# Patient Record
Sex: Female | Born: 1980 | Race: Black or African American | Hispanic: No | Marital: Married | Smoking: Former smoker
Health system: Southern US, Community
[De-identification: ages and names within clinical notes are randomized; demographics above are authoritative.]

## PROBLEM LIST (undated history)

## (undated) DIAGNOSIS — Z8759 Personal history of other complications of pregnancy, childbirth and the puerperium: Secondary | ICD-10-CM

## (undated) DIAGNOSIS — Z789 Other specified health status: Secondary | ICD-10-CM

## (undated) DIAGNOSIS — I10 Essential (primary) hypertension: Secondary | ICD-10-CM

---

## 2011-09-13 ENCOUNTER — Emergency Department (HOSPITAL_COMMUNITY): Payer: BC Managed Care – PPO

## 2011-09-13 ENCOUNTER — Emergency Department (HOSPITAL_COMMUNITY)
Admission: EM | Admit: 2011-09-13 | Discharge: 2011-09-13 | Disposition: A | Discharge status: Home / Self Care | Payer: BC Managed Care – PPO | Attending: Emergency Medicine | Admitting: Emergency Medicine

## 2011-09-13 ENCOUNTER — Encounter (HOSPITAL_COMMUNITY): Payer: Self-pay

## 2011-09-13 DIAGNOSIS — K8020 Calculus of gallbladder without cholecystitis without obstruction: Secondary | ICD-10-CM | POA: Insufficient documentation

## 2011-09-13 DIAGNOSIS — R112 Nausea with vomiting, unspecified: Secondary | ICD-10-CM | POA: Insufficient documentation

## 2011-09-13 DIAGNOSIS — D72829 Elevated white blood cell count, unspecified: Secondary | ICD-10-CM | POA: Insufficient documentation

## 2011-09-13 DIAGNOSIS — R1011 Right upper quadrant pain: Secondary | ICD-10-CM | POA: Insufficient documentation

## 2011-09-13 LAB — CBC WITH DIFFERENTIAL/PLATELET
HCT: 37.9 % (ref 36.0–46.0)
Hemoglobin: 12.4 g/dL (ref 12.0–15.0)
Lymphocytes Relative: 11 % — ABNORMAL LOW (ref 12–46)
MCHC: 32.7 g/dL (ref 30.0–36.0)
MCV: 80.3 fL (ref 78.0–100.0)
Monocytes Absolute: 0.8 10*3/uL (ref 0.1–1.0)
Monocytes Relative: 6 % (ref 3–12)
Neutro Abs: 11.6 10*3/uL — ABNORMAL HIGH (ref 1.7–7.7)
WBC: 14 10*3/uL — ABNORMAL HIGH (ref 4.0–10.5)

## 2011-09-13 LAB — URINALYSIS, ROUTINE W REFLEX MICROSCOPIC
Glucose, UA: NEGATIVE mg/dL
Hgb urine dipstick: NEGATIVE
Ketones, ur: 15 mg/dL — AB
Leukocytes, UA: NEGATIVE
pH: 7 (ref 5.0–8.0)

## 2011-09-13 LAB — COMPREHENSIVE METABOLIC PANEL
BUN: 7 mg/dL (ref 6–23)
CO2: 22 mEq/L (ref 19–32)
Chloride: 101 mEq/L (ref 96–112)
Creatinine, Ser: 0.79 mg/dL (ref 0.50–1.10)
GFR calc Af Amer: >90 mL/min (ref >90)
GFR calc non Af Amer: >90 mL/min (ref >90)
Total Bilirubin: 1.9 mg/dL — ABNORMAL HIGH (ref 0.3–1.2)

## 2011-09-13 LAB — POCT PREGNANCY, URINE: Preg Test, Ur: NEGATIVE

## 2011-09-13 LAB — LIPASE, BLOOD: Lipase: 24 U/L (ref 11–59)

## 2011-09-13 MED ORDER — ONDANSETRON HCL 4 MG/2ML IJ SOLN
4.0000 mg | Freq: Once | INTRAMUSCULAR | Status: AC
  Administered 2011-09-13: 4 mg via INTRAVENOUS
  Filled 2011-09-13: qty 2

## 2011-09-13 MED ORDER — ONDANSETRON 8 MG PO TBDP: 8.0000 mg | ORAL_TABLET | Freq: Three times a day (TID) | ORAL | Status: DC | PRN

## 2011-09-13 MED ORDER — SODIUM CHLORIDE 0.9 % IV SOLN
Freq: Once | INTRAVENOUS | Status: AC
  Administered 2011-09-13: 17:00:00 via INTRAVENOUS

## 2011-09-13 MED ORDER — HYDROCODONE-ACETAMINOPHEN 5-325 MG PO TABS: 2.0000 | ORAL_TABLET | Freq: Four times a day (QID) | ORAL | Status: AC | PRN

## 2011-09-13 MED ORDER — MORPHINE SULFATE 4 MG/ML IJ SOLN
4.0000 mg | Freq: Once | INTRAMUSCULAR | Status: AC
  Administered 2011-09-13: 4 mg via INTRAVENOUS
  Filled 2011-09-13: qty 1

## 2011-09-13 NOTE — ED Notes (Signed)
Pt complains of abd pain seen [facility] yesterday and pain subsided today pain subsided, and pt sts that came back today.

## 2011-09-13 NOTE — ED Provider Notes (Signed)
History     CSN: [id number]  Arrival date & time 09/13/11  1207   First MD Initiated Contact with Patient 09/13/11 1608      Chief Complaint  Patient presents with  . Abdominal Pain    (Consider location/radiation/quality/duration/timing/severity/associated sxs/prior treatment) HPI Comments: Patient presents today with RUQ abdominal pain.  Pain has been present intermittently over the past couple months, but became worse yesterday.  Pain is associated with eating spicy foods and foods high in fat.  She was seen at [facility] yesterday and was given prescription for Pepcid and Phenergan.  Her pain than eased up, but became worse again this morning.  Pain has been constant since this morning.  She had three episodes of vomiting yesterday and one episode of vomiting today.  She states that her emesis is undigested food.  She denies fever or chills.  No known history of Gallbladder disease.  No family history of gallbladder disease.    Patient is a 31 y.o. female presenting with abdominal pain. The history is provided by the patient.  Abdominal Pain The primary symptoms of the illness include abdominal pain, nausea and vomiting. The primary symptoms of the illness do not include fever, diarrhea or dysuria.  The patient states that she believes she is currently not pregnant. The patient has not had a change in bowel habit. Symptoms associated with the illness do not include chills, heartburn, constipation, urgency or frequency. Significant associated medical issues do not include gallstones.    No past medical history on file.  No past surgical history on file.  No family history on file.  History  Substance Use Topics  . Smoking status: Never Smoker   . Smokeless tobacco: Not on file  . Alcohol Use: No    OB History    Grav Para Term Preterm Abortions TAB SAB Ect Mult Living                  Review of Systems  Constitutional: Negative for fever and chills.  Cardiovascular: Negative for  chest pain.  Gastrointestinal: Positive for nausea, vomiting and abdominal pain. Negative for heartburn, diarrhea and constipation.  Genitourinary: Negative for dysuria, urgency and frequency.  Neurological: Negative for dizziness, syncope and light-headedness.    Allergies  Review of patient's allergies indicates no known allergies.  Home Medications   Current Outpatient Rx  Name Route Sig Dispense Refill  . CALCIUM-MAGNESIUM-ZINC PO Oral Take 1 tablet by mouth 2 (two) times daily with a meal.    . FAMOTIDINE 20 MG PO TABS Oral Take 20 mg by mouth 2 (two) times daily.    Marland Kitchen FISH OIL PO Oral Take 1 capsule by mouth 2 (two) times daily with a meal.    . PROMETHAZINE HCL 12.5 MG PO TABS Oral Take 12.5-25 mg by mouth every 6 (six) hours as needed. For nausea      BP 140/85  Pulse 63  Temp 98.5 F (36.9 C) (Oral)  Resp 16  SpO2 99%  Physical Exam  Nursing note and vitals reviewed. Constitutional: She appears well-developed and well-nourished. No distress.  HENT:  Head: Normocephalic and atraumatic.  Mouth/Throat: Oropharynx is clear and moist.  Neck: Normal range of motion. Neck supple.  Cardiovascular: Normal rate, regular rhythm and normal heart sounds.   Pulmonary/Chest: Effort normal and breath sounds normal.  Abdominal: Soft. Normal appearance and bowel sounds are normal. She exhibits no mass. There is tenderness in the right upper quadrant. There is no rigidity, no rebound,  no guarding, no tenderness at McBurney's point and negative Murphy's sign.  Musculoskeletal: Normal range of motion.  Neurological: She is alert.  Skin: Skin is warm and dry. She is not diaphoretic.  Psychiatric: She has a normal mood and affect.    ED Course  Procedures (including critical care time)  Labs Reviewed  URINALYSIS, ROUTINE W REFLEX MICROSCOPIC - Abnormal; Notable for the following:    Bilirubin Urine SMALL (*)     Ketones, ur 15 (*)     Urobilinogen, UA 2.0 (*)     All other  components within normal limits  CBC WITH DIFFERENTIAL - Abnormal; Notable for the following:    WBC 14.0 (*)     Neutrophils Relative 83 (*)     Neutro Abs 11.6 (*)     Lymphocytes Relative 11 (*)     All other components within normal limits  COMPREHENSIVE METABOLIC PANEL - Abnormal; Notable for the following:    Glucose, Bld 101 (*)     AST 168 (*)     ALT 200 (*)     Total Bilirubin 1.9 (*)     All other components within normal limits  POCT PREGNANCY, URINE  LIPASE, BLOOD   US Abdomen Complete  09/13/2011  *RADIOLOGY REPORT*  Clinical Data: Right upper quadrant pain  ABDOMEN ULTRASOUND  Technique:  Complete abdominal ultrasound examination was performed including evaluation of the liver, gallbladder, bile ducts, pancreas, kidneys, spleen, IVC, and abdominal aorta.  Comparison: No comparison studies available.  Findings:  Gallbladder:  Multiple tiny layering shadowing gallstones are evident, measuring up to about 7 mm in diameter.  There is no associated gallbladder wall thickening or pericholecystic fluid. The sonographer reports no sonographic Murphy's sign.  Gallbladder wall thickness measures up to about 2 mm.  Common Bile Duct:  Mildly increased in diameter at 7 mm.  Liver:  Normal.  No focal parenchymal abnormality.  Nointrahepatic biliary dilation.  IVC:  Normal.  Pancreas:  Normal.  Spleen:  Normal.  Right kidney:  10.2 cm in long axis.  Normal.  Left kidney:  10.5 cm in long axis.  Normal.  Abdominal Aorta:  No aneurysm.  Note:  There may be a trace right pleural effusion.  IMPRESSION: Cholelithiasis without gallbladder wall thickening or pericholecystic fluid.  The sonographer reports no sonographic Murphy's sign.  Extrahepatic common duct is mildly distended at 7 mm diameter.  If clinical picture is equivocal, nuclear scintigraphy may prove helpful to further evaluate.   Original Report Authenticated By: ERIC A. MANSELL, M.D.      No diagnosis found.  Patient also evaluated by  Dr. Rulon Abide.    MDM  Patient presenting with RUQ abdominal pain along with nausea and vomiting.  Symptoms had been occurring intermittently, but have been constant and worse today.  LFT's are elevated along with mild leukocytosis.  Abdominal ultrasound ordered to rule out Acute Cholecystitis.  Abdominal ultrasound did not show cholecystitis, but did show gallstones.  Discussed results and options with the patient.  She states that she does not want to be admitted and would rather follow up with Surgery and GI outpatient.  Her pain has resolved prior to discharge.  She has not had any vomiting while in the ED.   Patient discharged home with prescription for Zofran and pain medications.  Strict return precautions discussed with patient.          Pascal Lux [place], PA-C 09/14/11 1057

## 2011-09-14 ENCOUNTER — Encounter (INDEPENDENT_AMBULATORY_CARE_PROVIDER_SITE_OTHER): Payer: Self-pay | Admitting: General Surgery

## 2011-09-14 ENCOUNTER — Ambulatory Visit (INDEPENDENT_AMBULATORY_CARE_PROVIDER_SITE_OTHER): Payer: BC Managed Care – PPO | Admitting: General Surgery

## 2011-09-14 VITALS — BP 112/70 | HR 60 | Temp 98.2°F | Resp 18 | Ht 67.0 in | Wt 259.2 lb

## 2011-09-14 DIAGNOSIS — K8020 Calculus of gallbladder without cholecystitis without obstruction: Secondary | ICD-10-CM

## 2011-09-14 NOTE — ED Provider Notes (Signed)
Pt to follow with surgery next week - with h/o symptoms she is candidate for elective cholecystectomy.  Benign abdomen on exam - pain free on my exam.  Advised lifestyle changes including small, frequent meals limiting fatty and processed foods. Pt may gfollow up as outpatient, risks and benefits discussed, no guarantees of improvement on DC given. Given explicit return precautions for worsening pain, N/V, scleral icterus, fever.  Medical screening examination/treatment/procedure(s) were performed by non-physician practitioner and as supervising physician I was immediately available for consultation/collaboration.  Jones Skene, M.D.     Jones Skene, MD 09/14/11 1249

## 2011-09-14 NOTE — Progress Notes (Signed)
Patient ID: Samantha Jacobs, female   DOB: Aug 03, 1980, 31 y.o.   MRN: [id number]  Chief Complaint  Patient presents with  . Cholelithiasis    new pt    HPI Samantha Jacobs is a 31 y.o. female.  Referred by Dr. Pernell Dupre HPI This is a 31 year old female who is otherwise healthy who presents after numerous episodes of right upper quadrant pain that occurred over the past number of months. The last was this week for which she was seen in the emergency room last night. This was a sharp right upper quadrant and epigastric pain that did not go away on its own as he usually did. There is no radiation. She has some nausea and some emesis associated with this. She denies a fever. She was seen in the emergency room last I actually had some mild abnormalities in her liver function tests as well as an ultrasound that showed cholelithiasis. She was discharged to home and I am seeing her today in our office. She states that she feels fine right now.  History reviewed. No pertinent past medical history.  History reviewed. No pertinent past surgical history.  History reviewed. No pertinent family history.  Social History History  Substance Use Topics  . Smoking status: Former Smoker    Quit date: 01/09/2001  . Smokeless tobacco: Not on file  . Alcohol Use: Yes    No Known Allergies  Current Outpatient Prescriptions  Medication Sig Dispense Refill  . CALCIUM-MAGNESIUM-ZINC PO Take 1 tablet by mouth 2 (two) times daily with a meal.      . HYDROcodone-acetaminophen (NORCO/VICODIN) 5-325 MG per tablet Take 2 tablets by mouth every 6 (six) hours as needed for pain.  20 tablet  0  . Omega-3 Fatty Acids (FISH OIL PO) Take 1 capsule by mouth 2 (two) times daily with a meal.      . ondansetron (ZOFRAN ODT) 8 MG disintegrating tablet Take 1 tablet (8 mg total) by mouth every 8 (eight) hours as needed for nausea.  20 tablet  0  . promethazine (PHENERGAN) 12.5 MG tablet Take 12.5-25 mg by mouth every 6 (six) hours as  needed. For nausea       No current facility-administered medications for this visit.   Facility-Administered Medications Ordered in Other Visits  Medication Dose Route Frequency Provider Last Rate Last Dose  . 0.9 %  sodium chloride infusion   Intravenous Once Hurman Horn, MD      . morphine 4 MG/ML injection 4 mg  4 mg Intravenous Once [employer], PA-C   4 mg at 09/13/11 1656  . ondansetron (ZOFRAN) injection 4 mg  4 mg Intravenous Once [employer], PA-C   4 mg at 09/13/11 1656    Review of Systems Review of Systems  Constitutional: Negative for fever, chills and unexpected weight change.  HENT: Negative for hearing loss, congestion, sore throat, trouble swallowing and voice change.   Eyes: Negative for visual disturbance.  Respiratory: Negative for cough and wheezing.   Cardiovascular: Negative for chest pain, palpitations and leg swelling.  Gastrointestinal: Positive for nausea, vomiting and abdominal pain. Negative for diarrhea, constipation, blood in stool, abdominal distention and anal bleeding.  Genitourinary: Negative for hematuria, vaginal bleeding and difficulty urinating.  Musculoskeletal: Negative for arthralgias.  Skin: Negative for rash and wound.  Neurological: Negative for seizures, syncope and headaches.  Hematological: Negative for adenopathy. Does not bruise/bleed easily.  Psychiatric/Behavioral: Negative for confusion.    Blood pressure 112/70, pulse 60, temperature  98.2 F (36.8 C), temperature source Temporal, resp. rate 18, height 5\' 7"  (1.702 m), weight 259 lb 3.2 oz (117.572 kg).  Physical Exam Physical Exam  Vitals reviewed. Constitutional: She appears well-developed and well-nourished.  Eyes: No scleral icterus.  Neck: Neck supple.  Cardiovascular: Normal rate, regular rhythm and normal heart sounds.   Pulmonary/Chest: Effort normal and breath sounds normal. She has no wheezes. She has no rales.  Abdominal: Soft. Bowel sounds are  normal. There is no tenderness.  Lymphadenopathy:    She has no cervical adenopathy.    Data Reviewed ABDOMEN ULTRASOUND  Technique: Complete abdominal ultrasound examination was performed  including evaluation of the liver, gallbladder, bile ducts,  pancreas, kidneys, spleen, IVC, and abdominal aorta.  Comparison: No comparison studies available.  Findings:  Gallbladder: Multiple tiny layering shadowing gallstones are  evident, measuring up to about 7 mm in diameter. There is no  associated gallbladder wall thickening or pericholecystic fluid.  The sonographer reports no sonographic Murphy's sign. Gallbladder  wall thickness measures up to about 2 mm.  Common Bile Duct: Mildly increased in diameter at 7 mm.  Liver: Normal. No focal parenchymal abnormality. Nointrahepatic  biliary dilation.  IVC: Normal.  Pancreas: Normal.  Spleen: Normal.  Right kidney: 10.2 cm in long axis. Normal.  Left kidney: 10.5 cm in long axis. Normal.  Abdominal Aorta: No aneurysm.  Note: There may be a trace right pleural effusion.  IMPRESSION:  Cholelithiasis without gallbladder wall thickening or  pericholecystic fluid. The sonographer reports no sonographic  Murphy's sign. Extrahepatic common duct is mildly distended at 7  mm diameter. If clinical picture is equivocal, nuclear  scintigraphy may prove helpful to further evaluate.   Assessment    Symptomatic cholelithiasis     Plan    I discussed the procedure in detail.  The patient was given Agricultural engineer.  We discussed the risks and benefits of a laparoscopic cholecystectomy and possible cholangiogram including, but not limited to bleeding, infection, injury to surrounding structures such as the intestine or liver, bile leak, retained gallstones, need to convert to an open procedure, prolonged diarrhea, blood clots such as  DVT, common bile duct injury, anesthesia risks, and possible need for additional procedures.  The likelihood of  improvement in symptoms and return to the patient's normal status is good. We discussed the typical post-operative recovery course. I am also going to check her lfts asap just to make sure they are better.  I am concerned that she was discharged home with abnormal lfts and pain but she feels a lot better now.       Aveion Nguyen 09/14/2011, 4:49 PM

## 2011-09-15 ENCOUNTER — Telehealth (INDEPENDENT_AMBULATORY_CARE_PROVIDER_SITE_OTHER): Payer: Self-pay

## 2011-09-15 LAB — HEPATIC FUNCTION PANEL
Albumin: 4 g/dL (ref 3.5–5.2)
Total Protein: 7 g/dL (ref 6.0–8.3)

## 2011-09-15 NOTE — Telephone Encounter (Signed)
Called pt to let her know that labs are better and ok to wait to have sx next week per Dr Dwain Sarna.

## 2011-09-19 ENCOUNTER — Encounter (HOSPITAL_COMMUNITY): Payer: Self-pay | Admitting: Pharmacy Technician

## 2011-09-20 ENCOUNTER — Encounter (HOSPITAL_COMMUNITY)
Admission: RE | Admit: 2011-09-20 | Discharge: 2011-09-20 | Disposition: A | Discharge status: Home / Self Care | Payer: BC Managed Care – PPO | Source: Ambulatory Visit | Attending: General Surgery | Admitting: General Surgery

## 2011-09-20 ENCOUNTER — Encounter (HOSPITAL_COMMUNITY): Payer: Self-pay

## 2011-09-20 LAB — CBC WITH DIFFERENTIAL/PLATELET
Basophils Absolute: 0 10*3/uL (ref 0.0–0.1)
Eosinophils Relative: 3 % (ref 0–5)
Lymphocytes Relative: 34 % (ref 12–46)
MCV: 80.2 fL (ref 78.0–100.0)
Neutro Abs: 2.9 10*3/uL (ref 1.7–7.7)
Platelets: 356 10*3/uL (ref 150–400)
RDW: 13.2 % (ref 11.5–15.5)
WBC: 5.8 10*3/uL (ref 4.0–10.5)

## 2011-09-20 LAB — SURGICAL PCR SCREEN: Staphylococcus aureus: NEGATIVE

## 2011-09-20 LAB — COMPREHENSIVE METABOLIC PANEL
ALT: 65 U/L — ABNORMAL HIGH (ref 0–35)
AST: 27 U/L (ref 0–37)
Albumin: 3.8 g/dL (ref 3.5–5.2)
CO2: 26 mEq/L (ref 19–32)
Calcium: 9.8 mg/dL (ref 8.4–10.5)
GFR calc non Af Amer: >90 mL/min (ref >90)
Sodium: 136 mEq/L (ref 135–145)

## 2011-09-20 NOTE — Patient Instructions (Addendum)
20 Sheranda Suggett  09/20/2011   Your procedure is scheduled on: 09/21/11 AT 9:30 AM  Report to SHORT STAY DEPT  At 7:00 AM.  Call this number if you have problems the morning of surgery: [phone]   Remember:   Do not eat food or drink liquids AFTER MIDNIGHT    Take these medicines the morning of surgery with A SIP OF WATER: PRILOSEC   Do not wear jewelry, make-up or nail polish.  Do not wear lotions, powders, or perfumes.   Do not shave legs or underarms 48 hrs. before surgery (men may shave face)  Do not bring valuables to the hospital.  Contacts, dentures or bridgework may not be worn into surgery.  Leave suitcase in the car. After surgery it may be brought to your room.  For patients admitted to the hospital, checkout time is 11:00 AM the day of discharge.   Patients discharged the day of surgery will not be allowed to drive home. If going home same day of surgery, must have someone stay with you first 24 hrs at home and arrange for some one to drive you home from hospital.    Special Instructions:   Please read over the following fact sheets that you were given: MRSA  Information               SHOWER WITH BETASEPT THE NIGHT BEFORE SURGERY AND THE MORNING OF SURGERY               STOP ALL ASPIRIN PRODUCTS AND HERBAL MEDICATION 5 DAYS PREOP          X_______________________________________________________________

## 2011-09-21 ENCOUNTER — Ambulatory Visit (HOSPITAL_COMMUNITY): Payer: BC Managed Care – PPO

## 2011-09-21 ENCOUNTER — Ambulatory Visit (HOSPITAL_COMMUNITY): Payer: BC Managed Care – PPO | Admitting: Anesthesiology

## 2011-09-21 ENCOUNTER — Encounter (HOSPITAL_COMMUNITY): Payer: Self-pay | Admitting: Anesthesiology

## 2011-09-21 ENCOUNTER — Encounter (HOSPITAL_COMMUNITY)
Admission: RE | Disposition: A | Discharge status: Home / Self Care | Payer: Self-pay | Source: Ambulatory Visit | Attending: General Surgery

## 2011-09-21 ENCOUNTER — Encounter (HOSPITAL_COMMUNITY): Payer: Self-pay | Admitting: *Deleted

## 2011-09-21 ENCOUNTER — Observation Stay (HOSPITAL_COMMUNITY)
Admission: RE | Admit: 2011-09-21 | Discharge: 2011-09-22 | Disposition: A | Discharge status: Home / Self Care | Payer: BC Managed Care – PPO | Source: Ambulatory Visit | Attending: General Surgery | Admitting: General Surgery

## 2011-09-21 DIAGNOSIS — K8010 Calculus of gallbladder with chronic cholecystitis without obstruction: Principal | ICD-10-CM | POA: Insufficient documentation

## 2011-09-21 DIAGNOSIS — Z79899 Other long term (current) drug therapy: Secondary | ICD-10-CM | POA: Insufficient documentation

## 2011-09-21 DIAGNOSIS — Z01812 Encounter for preprocedural laboratory examination: Secondary | ICD-10-CM | POA: Insufficient documentation

## 2011-09-21 HISTORY — PX: CHOLECYSTECTOMY: SHX55

## 2011-09-21 SURGERY — LAPAROSCOPIC CHOLECYSTECTOMY WITH INTRAOPERATIVE CHOLANGIOGRAM
Anesthesia: General | Wound class: Contaminated

## 2011-09-21 MED ORDER — HYDROMORPHONE HCL PF 1 MG/ML IJ SOLN
0.5000 mg | INTRAMUSCULAR | Status: DC | PRN
  Administered 2011-09-21 (×3): 0.5 mg via INTRAVENOUS

## 2011-09-21 MED ORDER — NEOSTIGMINE METHYLSULFATE 1 MG/ML IJ SOLN
INTRAMUSCULAR | Status: DC | PRN
  Administered 2011-09-21: 4 mg via INTRAVENOUS

## 2011-09-21 MED ORDER — ONDANSETRON HCL 4 MG/2ML IJ SOLN
INTRAMUSCULAR | Status: AC
  Filled 2011-09-21: qty 2

## 2011-09-21 MED ORDER — GLUCAGON HCL (RDNA) 1 MG IJ SOLR
INTRAMUSCULAR | Status: DC | PRN
  Administered 2011-09-21: 5 mL via INTRAVENOUS

## 2011-09-21 MED ORDER — HYDROMORPHONE HCL PF 1 MG/ML IJ SOLN
INTRAMUSCULAR | Status: AC
  Filled 2011-09-21: qty 1

## 2011-09-21 MED ORDER — LACTATED RINGERS IV SOLN
INTRAVENOUS | Status: DC
  Administered 2011-09-21: 12:00:00 via INTRAVENOUS
  Administered 2011-09-21: 1000 mL via INTRAVENOUS

## 2011-09-21 MED ORDER — GLUCAGON HCL (RDNA) 1 MG IJ SOLR
INTRAMUSCULAR | Status: AC
  Filled 2011-09-21: qty 1

## 2011-09-21 MED ORDER — FENTANYL CITRATE 0.05 MG/ML IJ SOLN
25.0000 ug | INTRAMUSCULAR | Status: DC | PRN
  Administered 2011-09-21 (×2): 50 ug via INTRAVENOUS

## 2011-09-21 MED ORDER — BUPIVACAINE-EPINEPHRINE PF 0.25-1:200000 % IJ SOLN
INTRAMUSCULAR | Status: AC
  Filled 2011-09-21: qty 30

## 2011-09-21 MED ORDER — FENTANYL CITRATE 0.05 MG/ML IJ SOLN
INTRAMUSCULAR | Status: DC | PRN
  Administered 2011-09-21 (×2): 50 ug via INTRAVENOUS
  Administered 2011-09-21: 100 ug via INTRAVENOUS
  Administered 2011-09-21: 50 ug via INTRAVENOUS

## 2011-09-21 MED ORDER — ACETAMINOPHEN 650 MG RE SUPP: 650.0000 mg | Freq: Four times a day (QID) | RECTAL | Status: DC | PRN

## 2011-09-21 MED ORDER — DEXTROSE 5 % IV SOLN
2.0000 g | INTRAVENOUS | Status: AC
  Administered 2011-09-21: 2 g via INTRAVENOUS

## 2011-09-21 MED ORDER — FENTANYL CITRATE 0.05 MG/ML IJ SOLN
INTRAMUSCULAR | Status: AC
  Filled 2011-09-21: qty 2

## 2011-09-21 MED ORDER — ONDANSETRON HCL 4 MG/2ML IJ SOLN
INTRAMUSCULAR | Status: DC | PRN
  Administered 2011-09-21: 4 mg via INTRAVENOUS

## 2011-09-21 MED ORDER — IOHEXOL 300 MG/ML  SOLN
INTRAMUSCULAR | Status: DC | PRN
  Administered 2011-09-21: 50 mL via INTRAVENOUS

## 2011-09-21 MED ORDER — CEFOXITIN SODIUM-DEXTROSE 1-4 GM-% IV SOLR (PREMIX)
INTRAVENOUS | Status: AC
  Filled 2011-09-21: qty 100

## 2011-09-21 MED ORDER — GLYCOPYRROLATE 0.2 MG/ML IJ SOLN
INTRAMUSCULAR | Status: DC | PRN
  Administered 2011-09-21: .5 mg via INTRAVENOUS

## 2011-09-21 MED ORDER — BUPIVACAINE-EPINEPHRINE (PF) 0.25% -1:200000 IJ SOLN
INTRAMUSCULAR | Status: DC | PRN
  Administered 2011-09-21: 19 mL

## 2011-09-21 MED ORDER — HYDROCODONE-ACETAMINOPHEN 5-325 MG PO TABS
1.0000 | ORAL_TABLET | Freq: Four times a day (QID) | ORAL | Status: DC | PRN
  Administered 2011-09-21 (×3): 1 via ORAL
  Administered 2011-09-22 (×2): 2 via ORAL
  Filled 2011-09-21: qty 2
  Filled 2011-09-21 (×2): qty 1
  Filled 2011-09-21: qty 2
  Filled 2011-09-21: qty 1

## 2011-09-21 MED ORDER — LACTATED RINGERS IV SOLN: INTRAVENOUS | Status: DC

## 2011-09-21 MED ORDER — HEMOSTATIC AGENTS (NO CHARGE) OPTIME
TOPICAL | Status: DC | PRN
  Administered 2011-09-21: 1 via TOPICAL

## 2011-09-21 MED ORDER — ACETAMINOPHEN 325 MG PO TABS: 650.0000 mg | ORAL_TABLET | Freq: Four times a day (QID) | ORAL | Status: DC | PRN

## 2011-09-21 MED ORDER — SODIUM CHLORIDE 0.9 % IV SOLN
INTRAVENOUS | Status: DC
  Administered 2011-09-21 – 2011-09-22 (×2): via INTRAVENOUS

## 2011-09-21 MED ORDER — IOHEXOL 300 MG/ML  SOLN
INTRAMUSCULAR | Status: AC
  Filled 2011-09-21: qty 1

## 2011-09-21 MED ORDER — HEPARIN SODIUM (PORCINE) 5000 UNIT/ML IJ SOLN
5000.0000 [IU] | Freq: Three times a day (TID) | INTRAMUSCULAR | Status: DC
  Administered 2011-09-21 – 2011-09-22 (×3): 5000 [IU] via SUBCUTANEOUS
  Filled 2011-09-21 (×7): qty 1

## 2011-09-21 MED ORDER — ONDANSETRON HCL 4 MG/2ML IJ SOLN
4.0000 mg | Freq: Four times a day (QID) | INTRAMUSCULAR | Status: DC | PRN
  Administered 2011-09-21: 4 mg via INTRAVENOUS

## 2011-09-21 MED ORDER — LACTATED RINGERS IV SOLN
INTRAVENOUS | Status: DC | PRN
  Administered 2011-09-21: 1000 mL

## 2011-09-21 MED ORDER — PANTOPRAZOLE SODIUM 40 MG PO TBEC
40.0000 mg | DELAYED_RELEASE_TABLET | Freq: Every day | ORAL | Status: DC
  Administered 2011-09-21: 40 mg via ORAL
  Filled 2011-09-21 (×2): qty 1

## 2011-09-21 MED ORDER — ROCURONIUM BROMIDE 100 MG/10ML IV SOLN
INTRAVENOUS | Status: DC | PRN
  Administered 2011-09-21: 50 mg via INTRAVENOUS

## 2011-09-21 MED ORDER — DEXAMETHASONE SODIUM PHOSPHATE 10 MG/ML IJ SOLN
INTRAMUSCULAR | Status: DC | PRN
  Administered 2011-09-21: 10 mg via INTRAVENOUS

## 2011-09-21 MED ORDER — 0.9 % SODIUM CHLORIDE (POUR BTL) OPTIME
TOPICAL | Status: DC | PRN
  Administered 2011-09-21: 1000 mL

## 2011-09-21 MED ORDER — PROPOFOL 10 MG/ML IV BOLUS
INTRAVENOUS | Status: DC | PRN
  Administered 2011-09-21: 150 mg via INTRAVENOUS

## 2011-09-21 MED ORDER — MIDAZOLAM HCL 5 MG/5ML IJ SOLN
INTRAMUSCULAR | Status: DC | PRN
  Administered 2011-09-21: 2 mg via INTRAVENOUS

## 2011-09-21 MED ORDER — MORPHINE SULFATE 2 MG/ML IJ SOLN: 2.0000 mg | INTRAMUSCULAR | Status: DC | PRN

## 2011-09-21 SURGICAL SUPPLY — 38 items
APPLIER CLIP 5 13 M/L LIGAMAX5 (MISCELLANEOUS) ×4
APPLIER CLIP ROT 10 11.4 M/L (STAPLE)
BENZOIN TINCTURE PRP APPL 2/3 (GAUZE/BANDAGES/DRESSINGS) IMPLANT
CANISTER SUCTION 2500CC (MISCELLANEOUS) ×2 IMPLANT
CLIP APPLIE 5 13 M/L LIGAMAX5 (MISCELLANEOUS) ×2 IMPLANT
CLIP APPLIE ROT 10 11.4 M/L (STAPLE) IMPLANT
CLOTH BEACON ORANGE TIMEOUT ST (SAFETY) ×2 IMPLANT
COVER MAYO STAND STRL (DRAPES) IMPLANT
DECANTER SPIKE VIAL GLASS SM (MISCELLANEOUS) ×2 IMPLANT
DERMABOND ADVANCED (GAUZE/BANDAGES/DRESSINGS) ×1
DERMABOND ADVANCED .7 DNX12 (GAUZE/BANDAGES/DRESSINGS) ×1 IMPLANT
DRAPE C-ARM 42X72 X-RAY (DRAPES) IMPLANT
DRAPE LAPAROSCOPIC ABDOMINAL (DRAPES) ×2 IMPLANT
ELECT REM PT RETURN 9FT ADLT (ELECTROSURGICAL) ×2
ELECTRODE REM PT RTRN 9FT ADLT (ELECTROSURGICAL) ×1 IMPLANT
GLOVE BIO SURGEON STRL SZ7 (GLOVE) ×2 IMPLANT
GLOVE BIOGEL PI IND STRL 7.5 (GLOVE) ×1 IMPLANT
GLOVE BIOGEL PI INDICATOR 7.5 (GLOVE) ×1
GOWN PREVENTION PLUS LG XLONG (DISPOSABLE) ×2 IMPLANT
GOWN PREVENTION PLUS XLARGE (GOWN DISPOSABLE) ×2 IMPLANT
GOWN STRL NON-REIN LRG LVL3 (GOWN DISPOSABLE) ×2 IMPLANT
GOWN STRL REIN XL XLG (GOWN DISPOSABLE) ×2 IMPLANT
HEMOSTAT SNOW SURGICEL 2X4 (HEMOSTASIS) ×2 IMPLANT
KIT BASIN OR (CUSTOM PROCEDURE TRAY) ×2 IMPLANT
NS IRRIG 1000ML POUR BTL (IV SOLUTION) ×2 IMPLANT
POUCH SPECIMEN RETRIEVAL 10MM (ENDOMECHANICALS) ×2 IMPLANT
SET CHOLANGIOGRAPH MIX (MISCELLANEOUS) ×2 IMPLANT
SET IRRIG TUBING LAPAROSCOPIC (IRRIGATION / IRRIGATOR) ×2 IMPLANT
SOLUTION ANTI FOG 6CC (MISCELLANEOUS) ×2 IMPLANT
STRIP CLOSURE SKIN 1/2X4 (GAUZE/BANDAGES/DRESSINGS) IMPLANT
SUT MNCRL AB 4-0 PS2 18 (SUTURE) ×2 IMPLANT
SUT VICRYL 0 UR6 27IN ABS (SUTURE) ×4 IMPLANT
TOWEL OR 17X26 10 PK STRL BLUE (TOWEL DISPOSABLE) ×2 IMPLANT
TRAY LAP CHOLE (CUSTOM PROCEDURE TRAY) ×2 IMPLANT
TROCAR BLADELESS OPT 5 75 (ENDOMECHANICALS) ×6 IMPLANT
TROCAR XCEL BLUNT TIP 100MML (ENDOMECHANICALS) ×2 IMPLANT
TROCAR XCEL NON-BLD 11X100MML (ENDOMECHANICALS) IMPLANT
TUBING INSUFFLATION 10FT LAP (TUBING) ×2 IMPLANT

## 2011-09-21 NOTE — H&P (View-Only) (Signed)
Patient ID: Samantha Jacobs, female   DOB: 02/06/1980, 31 y.o.   MRN: [id number]  Chief Complaint  Patient presents with  . Cholelithiasis    new pt    HPI Kaliyan Phang is a 31 y.o. female.  Referred by Dr. Adams HPI This is a 31-year-old female who is otherwise healthy who presents after numerous episodes of right upper quadrant pain that occurred over the past number of months. The last was this week for which she was seen in the emergency room last night. This was a sharp right upper quadrant and epigastric pain that did not go away on its own as he usually did. There is no radiation. She has some nausea and some emesis associated with this. She denies a fever. She was seen in the emergency room last I actually had some mild abnormalities in her liver function tests as well as an ultrasound that showed cholelithiasis. She was discharged to home and I am seeing her today in our office. She states that she feels fine right now.  History reviewed. No pertinent past medical history.  History reviewed. No pertinent past surgical history.  History reviewed. No pertinent family history.  Social History History  Substance Use Topics  . Smoking status: Former Smoker    Quit date: 01/09/2001  . Smokeless tobacco: Not on file  . Alcohol Use: Yes    No Known Allergies  Current Outpatient Prescriptions  Medication Sig Dispense Refill  . CALCIUM-MAGNESIUM-ZINC PO Take 1 tablet by mouth 2 (two) times daily with a meal.      . HYDROcodone-acetaminophen (NORCO/VICODIN) 5-325 MG per tablet Take 2 tablets by mouth every 6 (six) hours as needed for pain.  20 tablet  0  . Omega-3 Fatty Acids (FISH OIL PO) Take 1 capsule by mouth 2 (two) times daily with a meal.      . ondansetron (ZOFRAN ODT) 8 MG disintegrating tablet Take 1 tablet (8 mg total) by mouth every 8 (eight) hours as needed for nausea.  20 tablet  0  . promethazine (PHENERGAN) 12.5 MG tablet Take 12.5-25 mg by mouth every 6 (six) hours as  needed. For nausea       No current facility-administered medications for this visit.   Facility-Administered Medications Ordered in Other Visits  Medication Dose Route Frequency Provider Last Rate Last Dose  . 0.9 %  sodium chloride infusion   Intravenous Once John M Bednar, MD      . morphine 4 MG/ML injection 4 mg  4 mg Intravenous Once Heather Van Wingen, PA-C   4 mg at 09/13/11 1656  . ondansetron (ZOFRAN) injection 4 mg  4 mg Intravenous Once Heather Van Wingen, PA-C   4 mg at 09/13/11 1656    Review of Systems Review of Systems  Constitutional: Negative for fever, chills and unexpected weight change.  HENT: Negative for hearing loss, congestion, sore throat, trouble swallowing and voice change.   Eyes: Negative for visual disturbance.  Respiratory: Negative for cough and wheezing.   Cardiovascular: Negative for chest pain, palpitations and leg swelling.  Gastrointestinal: Positive for nausea, vomiting and abdominal pain. Negative for diarrhea, constipation, blood in stool, abdominal distention and anal bleeding.  Genitourinary: Negative for hematuria, vaginal bleeding and difficulty urinating.  Musculoskeletal: Negative for arthralgias.  Skin: Negative for rash and wound.  Neurological: Negative for seizures, syncope and headaches.  Hematological: Negative for adenopathy. Does not bruise/bleed easily.  Psychiatric/Behavioral: Negative for confusion.    Blood pressure 112/70, pulse 60, temperature   98.2 F (36.8 C), temperature source Temporal, resp. rate 18, height 5' 7" (1.702 m), weight 259 lb 3.2 oz (117.572 kg).  Physical Exam Physical Exam  Vitals reviewed. Constitutional: She appears well-developed and well-nourished.  Eyes: No scleral icterus.  Neck: Neck supple.  Cardiovascular: Normal rate, regular rhythm and normal heart sounds.   Pulmonary/Chest: Effort normal and breath sounds normal. She has no wheezes. She has no rales.  Abdominal: Soft. Bowel sounds are  normal. There is no tenderness.  Lymphadenopathy:    She has no cervical adenopathy.    Data Reviewed ABDOMEN ULTRASOUND  Technique: Complete abdominal ultrasound examination was performed  including evaluation of the liver, gallbladder, bile ducts,  pancreas, kidneys, spleen, IVC, and abdominal aorta.  Comparison: No comparison studies available.  Findings:  Gallbladder: Multiple tiny layering shadowing gallstones are  evident, measuring up to about 7 mm in diameter. There is no  associated gallbladder wall thickening or pericholecystic fluid.  The sonographer reports no sonographic Murphy's sign. Gallbladder  wall thickness measures up to about 2 mm.  Common Bile Duct: Mildly increased in diameter at 7 mm.  Liver: Normal. No focal parenchymal abnormality. Nointrahepatic  biliary dilation.  IVC: Normal.  Pancreas: Normal.  Spleen: Normal.  Right kidney: 10.2 cm in long axis. Normal.  Left kidney: 10.5 cm in long axis. Normal.  Abdominal Aorta: No aneurysm.  Note: There may be a trace right pleural effusion.  IMPRESSION:  Cholelithiasis without gallbladder wall thickening or  pericholecystic fluid. The sonographer reports no sonographic  Murphy's sign. Extrahepatic common duct is mildly distended at 7  mm diameter. If clinical picture is equivocal, nuclear  scintigraphy may prove helpful to further evaluate.   Assessment    Symptomatic cholelithiasis     Plan    I discussed the procedure in detail.  The patient was given educational material.  We discussed the risks and benefits of a laparoscopic cholecystectomy and possible cholangiogram including, but not limited to bleeding, infection, injury to surrounding structures such as the intestine or liver, bile leak, retained gallstones, need to convert to an open procedure, prolonged diarrhea, blood clots such as  DVT, common bile duct injury, anesthesia risks, and possible need for additional procedures.  The likelihood of  improvement in symptoms and return to the patient's normal status is good. We discussed the typical post-operative recovery course. I am also going to check her lfts asap just to make sure they are better.  I am concerned that she was discharged home with abnormal lfts and pain but she feels a lot better now.       Deema Juncaj 09/14/2011, 4:49 PM    

## 2011-09-21 NOTE — Transfer of Care (Signed)
Immediate Anesthesia Transfer of Care Note  Patient: Samantha Jacobs  Procedure(s) Performed: Procedure(s) (LRB) with comments: LAPAROSCOPIC CHOLECYSTECTOMY WITH INTRAOPERATIVE CHOLANGIOGRAM (N/A) - laparoscopic cholescystectomy with cholangiogram  Patient Location: PACU  Anesthesia Type: General  Level of Consciousness: awake, alert  and patient cooperative  Airway & Oxygen Therapy: Patient Spontanous Breathing and Patient connected to face mask oxygen  Post-op Assessment: Report given to PACU RN and Post -op Vital signs reviewed and stable  Post vital signs: Reviewed and stable  Complications: No apparent anesthesia complications

## 2011-09-21 NOTE — Anesthesia Preprocedure Evaluation (Signed)

## 2011-09-21 NOTE — Interval H&P Note (Signed)
History and Physical Interval Note:  09/21/2011 9:12 AM  Samantha Jacobs  has presented today for surgery, with the diagnosis of gallstones  The various methods of treatment have been discussed with the patient and family. After consideration of risks, benefits and other options for treatment, the patient has consented to  Procedure(s) (LRB) with comments: LAPAROSCOPIC CHOLECYSTECTOMY WITH INTRAOPERATIVE CHOLANGIOGRAM (N/A) - laparoscopic cholescystectomy with cholangiogram as a surgical intervention .  The patient's history has been reviewed, patient examined, no change in status, stable for surgery.  I have reviewed the patient's chart and labs.  Questions were answered to the patient's satisfaction.     Laszlo Ellerby

## 2011-09-21 NOTE — Preoperative (Signed)
Beta Blockers   Reason not to administer Beta Blockers:Not Applicable 

## 2011-09-21 NOTE — Anesthesia Postprocedure Evaluation (Signed)
  Anesthesia Post-op Note  Patient: Hydrographic surveyor  Procedure(s) Performed: Procedure(s) (LRB): LAPAROSCOPIC CHOLECYSTECTOMY WITH INTRAOPERATIVE CHOLANGIOGRAM (N/A)  Patient Location: PACU  Anesthesia Type: General  Level of Consciousness: awake and alert   Airway and Oxygen Therapy: Patient Spontanous Breathing  Post-op Pain: mild  Post-op Assessment: Post-op Vital signs reviewed, Patient's Cardiovascular Status Stable, Respiratory Function Stable, Patent Airway and No signs of Nausea or vomiting  Post-op Vital Signs: stable  Complications: No apparent anesthesia complications

## 2011-09-21 NOTE — Op Note (Signed)
Preoperative diagnosis: Likely choledocholithiasis, symptomatic cholelithiasis Postoperative diagnosis: Same as above Procedure: Laparoscopic cholecystectomy with cholangiogram Surgeon: Dr. Harden Mo Anesthesia: Gen. Endotracheal Estimated blood loss: Minimal Specimens: Gallbladder and contents to pathology Complications: None Drains: None Sponge needle count correct x2 at end of operation Disposition to recovery stable  Indications: This is a 31 year old female with a history of right upper quadrant pain he was seen in the emergency room last week. She was noted to have elevated liver function tests at that time and was sent home. I saw her in my office and she still continued to have some intermittent pain. Her liver function tests were better at that time and I scheduled her this week for a laparoscopic cholecystectomy after discussing the risks and benefits associated with the operation.  Procedure: After informed consent was obtained the patient was taken to the operating room. She was given cefoxitin. Sequential compression devices were placed on her legs prior to beginning. She was then placed under general endotracheal anesthesia without complication. Her abdomen was prepped and draped in the standard sterile surgical fashion. Surgical timeout was performed.  I infiltrated Marcaine below her umbilicus. I then made a vertical incision and carried this to her fascia. The fascia was entered sharply and the peritoneum was entered bluntly. I then placed a 0 Vicryl pursestring suture through the fascia. A Hassan trocar was introduced and the abdomen was insufflated to 15 mmHg pressure. I then placed 3 further 5 mm ports in the epigastrium and right upper quadrant. The gallbladder was then retracted cephalad. She had a lot of adhesions that were taken down bluntly and with cautery dissection. Eventually I was able to retract the gallbladder cephalad and lateral. With some difficulty due to  scarring I was able to obtain the critical view of safety. I clipped the artery and divided this. There was also a smaller posterior branch I treated in a similar fashion. She clearly had stones in her cystic duct. I placed a clip distally. I then made a hole in her duct. I extracted a fair amount of stones from her cystic duct. I then inserted a Cook catheter and did a cholangiogram. She looked like she might have some small stones in her duct. I was in the cystic duct into both sides of her liver filled. Despite multiple maneuvers including glucagon I never got her duodenum to fill. I removed the catheter clipped the duct and divided this. I then removed the gallbladder from the liver bed without difficulty. I then placed an Endo Catch bag and removed it from the umbilicus.I then obtained hemostasis. Irrigation was performed until this was clear. I then removed the [facility] trocar and close this down. I placed an additional 0 Vicryl suture to obliterate this defect. I then desufflated the abdomen removed all trocars. I closed these with 4 Monocryl and Dermabond. She tolerated this well was extubated and transferred to recovery stable.

## 2011-09-22 ENCOUNTER — Encounter (HOSPITAL_COMMUNITY): Payer: Self-pay | Admitting: General Surgery

## 2011-09-22 LAB — COMPREHENSIVE METABOLIC PANEL
ALT: 59 U/L — ABNORMAL HIGH (ref 0–35)
Alkaline Phosphatase: 80 U/L (ref 39–117)
BUN: 6 mg/dL (ref 6–23)
CO2: 21 mEq/L (ref 19–32)
Calcium: 8.9 mg/dL (ref 8.4–10.5)
GFR calc Af Amer: >90 mL/min (ref >90)
GFR calc non Af Amer: >90 mL/min (ref >90)
Glucose, Bld: 121 mg/dL — ABNORMAL HIGH (ref 70–99)
Total Protein: 7 g/dL (ref 6.0–8.3)

## 2011-09-22 MED ORDER — HYDROCODONE-ACETAMINOPHEN 10-325 MG PO TABS: 1.0000 | ORAL_TABLET | Freq: Four times a day (QID) | ORAL | Status: DC | PRN

## 2011-09-22 NOTE — Progress Notes (Signed)
Patient provided with discharge instructions and prescriptions. Patient verbalized understanding. Patient discharged to home. 

## 2011-09-22 NOTE — Discharge Summary (Signed)
Physician Discharge Summary  Patient ID: Samantha Jacobs MRN: [id number] DOB/AGE: 1980-03-25 31 y.o.  Admit date: 09/21/2011 Discharge date: 09/22/2011  Admission Diagnoses: Symptomatic cholelithiasis, history of choledocholithiasis  Discharge Diagnoses:  Same as above  Discharged Condition: good  Hospital Course: 39 yof with history of abnormal lfts and right upper quadrant pain who was seen in er and sent home. I saw her in office and scheduled for laparoscopic cholecystectomy. This was performed uneventfully except I could not get her duodenum to fill on cholangiogram despite multiple measures.  She remained overnight and I checked lfts the following day which show a normal bilirubin.  She is doing well and will be discharged home.  Consults: None  Significant Diagnostic Studies: none  Treatments: surgery: laparoscopic cholecystectomy   Disposition: 01-Home or Self Care  Discharge Orders    Future Appointments: Provider: Department: Dept Phone: Center:   10/20/2011 11:10 AM Emelia Loron, MD [facility] [phone] None       Medication List     As of 09/22/2011  1:40 PM    TAKE these medications         CALCIUM-MAGNESIUM-ZINC PO   Take 1 tablet by mouth 2 (two) times daily with a meal.      FISH OIL PO   Take 1 capsule by mouth 2 (two) times daily with a meal.      HYDROcodone-acetaminophen 5-325 MG per tablet   Commonly known as: NORCO/VICODIN   Take 2 tablets by mouth every 6 (six) hours as needed for pain.      HYDROcodone-acetaminophen 10-325 MG per tablet   Commonly known as: NORCO   Take 1 tablet by mouth every 6 (six) hours as needed for pain.      omeprazole 20 MG capsule   Commonly known as: PRILOSEC   Take 20 mg by mouth daily.      promethazine 12.5 MG tablet   Commonly known as: PHENERGAN   Take 12.5-25 mg by mouth every 6 (six) hours as needed. For nausea           Follow-up Information    Follow up with [facility], [facility]. In 3  weeks.   Contact information:   [address] [place] [address] [phone]          Signed: Emelia Loron 09/22/2011, 1:40 PM

## 2011-09-22 NOTE — Care Management Note (Signed)
    Page 1 of 1   09/22/2011     10:34:37 AM   CARE MANAGEMENT NOTE 09/22/2011  Patient:  Samantha Jacobs, Samantha Jacobs   Account Number:  [id number]  Date Initiated:  09/22/2011  Documentation initiated by:  Lorenda Ishihara  Subjective/Objective Assessment:     Action/Plan:   Anticipated DC Date:  09/22/2011   Anticipated DC Plan:  HOME/SELF CARE      DC Planning Services  CM consult      Choice offered to / List presented to:             Status of service:  Completed, signed off Medicare Important Message given?   (If response is "NO", the following Medicare IM given date fields will be blank) Date Medicare IM given:   Date Additional Medicare IM given:    Discharge Disposition:  HOME/SELF CARE  Per UR Regulation:  Reviewed for med. necessity/level of care/duration of stay  If discussed at Long Length of Stay Meetings, dates discussed:    Comments:

## 2011-10-10 ENCOUNTER — Encounter (INDEPENDENT_AMBULATORY_CARE_PROVIDER_SITE_OTHER): Payer: Self-pay | Admitting: General Surgery

## 2011-10-20 ENCOUNTER — Encounter (INDEPENDENT_AMBULATORY_CARE_PROVIDER_SITE_OTHER): Payer: Self-pay | Admitting: General Surgery

## 2011-10-20 ENCOUNTER — Ambulatory Visit (INDEPENDENT_AMBULATORY_CARE_PROVIDER_SITE_OTHER): Payer: BC Managed Care – PPO | Admitting: General Surgery

## 2011-10-20 VITALS — BP 124/72 | HR 76 | Temp 97.9°F | Resp 18 | Ht 67.5 in | Wt 257.0 lb

## 2011-10-20 DIAGNOSIS — Z09 Encounter for follow-up examination after completed treatment for conditions other than malignant neoplasm: Secondary | ICD-10-CM

## 2011-10-20 NOTE — Progress Notes (Signed)
Subjective:     Patient ID: Samantha Jacobs, female   DOB: 08/29/1980, 31 y.o.   MRN: [id number]  HPI This is a 31 year old female had a laparoscopic cholecystectomy with cholangiogram on. She had a history of what sounded like choledocholithiasis. She had a lot of stones in her cystic duct I milked out. I could never really get her duodenum to fill despite multiple maneuvers. She returns today doing well without any complaints at all. She is eating well. She has no pain. She is having normal bowel movements now. Her pathology showed chronic cholecystitis and cholelithiasis.  Review of Systems     Objective:   Physical Exam Well-healing laparoscopic incisions without infection    Assessment:     Status post laparoscopic cholecystectomy    Plan:      I discussed she said small risk of having a retained stone but I think this is unlikely now. I released to full activity and I will see her back as needed. If she develops any recurrent symptoms she needs a call as soon as possible.

## 2016-09-26 ENCOUNTER — Other Ambulatory Visit (HOSPITAL_COMMUNITY)
Admission: RE | Admit: 2016-09-26 | Discharge: 2016-09-26 | Disposition: A | Discharge status: Home / Self Care | Payer: BLUE CROSS/BLUE SHIELD | Source: Ambulatory Visit | Attending: Obstetrics and Gynecology | Admitting: Obstetrics and Gynecology

## 2016-09-26 ENCOUNTER — Other Ambulatory Visit: Payer: Self-pay | Admitting: Obstetrics and Gynecology

## 2016-09-26 DIAGNOSIS — Z124 Encounter for screening for malignant neoplasm of cervix: Secondary | ICD-10-CM | POA: Diagnosis present

## 2016-09-27 LAB — OB RESULTS CONSOLE HEPATITIS B SURFACE ANTIGEN: HEP B S AG: NEGATIVE

## 2016-09-27 LAB — OB RESULTS CONSOLE GC/CHLAMYDIA
Chlamydia: NEGATIVE
Gonorrhea: NEGATIVE

## 2016-09-27 LAB — OB RESULTS CONSOLE HIV ANTIBODY (ROUTINE TESTING): HIV: NONREACTIVE

## 2016-09-27 LAB — OB RESULTS CONSOLE RUBELLA ANTIBODY, IGM: RUBELLA: IMMUNE

## 2016-09-27 LAB — OB RESULTS CONSOLE RPR: RPR: NONREACTIVE

## 2016-09-29 LAB — CYTOLOGY - PAP
DIAGNOSIS: NEGATIVE
HPV: NOT DETECTED

## 2017-04-25 ENCOUNTER — Encounter (HOSPITAL_COMMUNITY): Payer: Self-pay | Admitting: *Deleted

## 2017-04-25 ENCOUNTER — Inpatient Hospital Stay (HOSPITAL_COMMUNITY)
Admission: AD | Admit: 2017-04-25 | Discharge: 2017-04-29 | DRG: 788 | Disposition: A | Discharge status: Home / Self Care | Payer: BLUE CROSS/BLUE SHIELD | Source: Ambulatory Visit | Attending: Obstetrics and Gynecology | Admitting: Obstetrics and Gynecology

## 2017-04-25 DIAGNOSIS — O9902 Anemia complicating childbirth: Secondary | ICD-10-CM | POA: Diagnosis present

## 2017-04-25 DIAGNOSIS — D649 Anemia, unspecified: Secondary | ICD-10-CM | POA: Diagnosis present

## 2017-04-25 DIAGNOSIS — O480 Post-term pregnancy: Principal | ICD-10-CM | POA: Diagnosis present

## 2017-04-25 DIAGNOSIS — Z3A40 40 weeks gestation of pregnancy: Secondary | ICD-10-CM

## 2017-04-25 DIAGNOSIS — O134 Gestational [pregnancy-induced] hypertension without significant proteinuria, complicating childbirth: Secondary | ICD-10-CM | POA: Diagnosis present

## 2017-04-25 DIAGNOSIS — Z87891 Personal history of nicotine dependence: Secondary | ICD-10-CM | POA: Diagnosis not present

## 2017-04-25 DIAGNOSIS — O99214 Obesity complicating childbirth: Secondary | ICD-10-CM | POA: Diagnosis present

## 2017-04-25 DIAGNOSIS — Z3400 Encounter for supervision of normal first pregnancy, unspecified trimester: Secondary | ICD-10-CM

## 2017-04-25 HISTORY — DX: Other specified health status: Z78.9

## 2017-04-25 LAB — COMPREHENSIVE METABOLIC PANEL
ALK PHOS: 180 U/L — AB (ref 38–126)
ALT: 22 U/L (ref 14–54)
AST: 24 U/L (ref 15–41)
Albumin: 3.2 g/dL — ABNORMAL LOW (ref 3.5–5.0)
Anion gap: 11 (ref 5–15)
BUN: 9 mg/dL (ref 6–20)
CALCIUM: 9.4 mg/dL (ref 8.9–10.3)
CO2: 18 mmol/L — ABNORMAL LOW (ref 22–32)
CREATININE: 0.62 mg/dL (ref 0.44–1.00)
Chloride: 107 mmol/L (ref 101–111)
Glucose, Bld: 84 mg/dL (ref 65–99)
Potassium: 4.4 mmol/L (ref 3.5–5.1)
Sodium: 136 mmol/L (ref 135–145)
Total Bilirubin: 0.2 mg/dL — ABNORMAL LOW (ref 0.3–1.2)
Total Protein: 7.1 g/dL (ref 6.5–8.1)

## 2017-04-25 LAB — CBC
HEMATOCRIT: 33.1 % — AB (ref 36.0–46.0)
Hemoglobin: 11.2 g/dL — ABNORMAL LOW (ref 12.0–15.0)
MCH: 26.5 pg (ref 26.0–34.0)
MCHC: 33.8 g/dL (ref 30.0–36.0)
MCV: 78.3 fL (ref 78.0–100.0)
PLATELETS: 282 10*3/uL (ref 150–400)
RBC: 4.23 MIL/uL (ref 3.87–5.11)
RDW: 14 % (ref 11.5–15.5)
WBC: 7.1 10*3/uL (ref 4.0–10.5)

## 2017-04-25 LAB — TYPE AND SCREEN
ABO/RH(D): B POS
Antibody Screen: NEGATIVE

## 2017-04-25 LAB — PROTEIN / CREATININE RATIO, URINE
Creatinine, Urine: 101 mg/dL
Protein Creatinine Ratio: 0.09 mg/mg{Cre} (ref 0.00–0.15)
TOTAL PROTEIN, URINE: 9 mg/dL

## 2017-04-25 LAB — ABO/RH: ABO/RH(D): B POS

## 2017-04-25 MED ORDER — OXYTOCIN 40 UNITS IN LACTATED RINGERS INFUSION - SIMPLE MED
1.0000 m[IU]/min | INTRAVENOUS | Status: DC
  Administered 2017-04-25: 1 m[IU]/min via INTRAVENOUS
  Administered 2017-04-26: 2 m[IU]/min via INTRAVENOUS
  Administered 2017-04-26: 1 m[IU]/min via INTRAVENOUS
  Filled 2017-04-25: qty 1000

## 2017-04-25 MED ORDER — ZOLPIDEM TARTRATE 5 MG PO TABS
5.0000 mg | ORAL_TABLET | Freq: Every evening | ORAL | Status: DC | PRN
  Administered 2017-04-25: 5 mg via ORAL
  Filled 2017-04-25: qty 1

## 2017-04-25 MED ORDER — ONDANSETRON HCL 4 MG/2ML IJ SOLN: 4.0000 mg | Freq: Four times a day (QID) | INTRAMUSCULAR | Status: DC | PRN

## 2017-04-25 MED ORDER — OXYCODONE-ACETAMINOPHEN 5-325 MG PO TABS: 1.0000 | ORAL_TABLET | ORAL | Status: DC | PRN

## 2017-04-25 MED ORDER — SOD CITRATE-CITRIC ACID 500-334 MG/5ML PO SOLN: 30.0000 mL | ORAL | Status: DC | PRN

## 2017-04-25 MED ORDER — ACETAMINOPHEN 325 MG PO TABS: 650.0000 mg | ORAL_TABLET | ORAL | Status: DC | PRN

## 2017-04-25 MED ORDER — LACTATED RINGERS IV SOLN
INTRAVENOUS | Status: DC
  Administered 2017-04-25 (×2): via INTRAVENOUS

## 2017-04-25 MED ORDER — HYDROXYZINE HCL 50 MG PO TABS: 50.0000 mg | ORAL_TABLET | Freq: Four times a day (QID) | ORAL | Status: DC | PRN

## 2017-04-25 MED ORDER — OXYTOCIN BOLUS FROM INFUSION: 500.0000 mL | Freq: Once | INTRAVENOUS | Status: DC

## 2017-04-25 MED ORDER — OXYCODONE-ACETAMINOPHEN 5-325 MG PO TABS: 2.0000 | ORAL_TABLET | ORAL | Status: DC | PRN

## 2017-04-25 MED ORDER — LIDOCAINE HCL (PF) 1 % IJ SOLN: 30.0000 mL | INTRAMUSCULAR | Status: DC | PRN

## 2017-04-25 MED ORDER — LACTATED RINGERS IV SOLN
500.0000 mL | INTRAVENOUS | Status: DC | PRN
  Administered 2017-04-25 – 2017-04-26 (×3): 500 mL via INTRAVENOUS

## 2017-04-25 MED ORDER — MISOPROSTOL 25 MCG QUARTER TABLET
25.0000 ug | ORAL_TABLET | ORAL | Status: DC | PRN
  Administered 2017-04-25: 25 ug via VAGINAL
  Filled 2017-04-25: qty 1

## 2017-04-25 MED ORDER — TERBUTALINE SULFATE 1 MG/ML IJ SOLN: 0.2500 mg | Freq: Once | INTRAMUSCULAR | Status: DC | PRN

## 2017-04-25 MED ORDER — BUTORPHANOL TARTRATE 1 MG/ML IJ SOLN
1.0000 mg | INTRAMUSCULAR | Status: DC | PRN
  Administered 2017-04-25 – 2017-04-26 (×3): 1 mg via INTRAVENOUS
  Filled 2017-04-25 (×3): qty 1

## 2017-04-25 MED ORDER — OXYTOCIN 40 UNITS IN LACTATED RINGERS INFUSION - SIMPLE MED: 2.5000 [IU]/h | INTRAVENOUS | Status: DC

## 2017-04-25 NOTE — Anesthesia Pain Management Evaluation Note (Signed)
  CRNA Pain Management Visit Note  Patient: Samantha Jacobs, 37 y.o., female  "Hello I am a member of the anesthesia team at [facility]. We have an anesthesia team available at all times to provide care throughout the hospital, including epidural management and anesthesia for C-section. I don't know your plan for the delivery whether it a natural birth, water birth, IV sedation, nitrous supplementation, doula or epidural, but we want to meet your pain goals."   1.Was your pain managed to your expectations on prior hospitalizations?   No prior hospitalizations  2.What is your expectation for pain management during this hospitalization?     Epidural  3.How can we help you reach that goal? Epidural when ready  Record the patient's initial score and the patient's pain goal.   Pain: 0  Pain Goal: 7 [facility] wants you to be able to say your pain was always managed very well.  Edison PaceWILKERSON,Samantha Jacobs 04/25/2017

## 2017-04-25 NOTE — H&P (Signed)
Samantha Jacobs is a 37 y.o. female G1  @ 40 5/7 weeks presenting for IOL due to postdates.  Pregnancy has been complicated [facility], placenta previa (resolved in second trimester) and anemia of pregnancy.  Upon arrival, BPs have remained in the 140s/80s.  Pt denies headaches, visual changes or upper abdominal pain. OB History    Gravida  1   Para      Term      Preterm      AB      Living        SAB      TAB      Ectopic      Multiple      Live Births             Past Medical History:  Diagnosis Date  . Medical history non-contributory    Past Surgical History:  Procedure Laterality Date  . CHOLECYSTECTOMY  09/21/2011   Procedure: LAPAROSCOPIC CHOLECYSTECTOMY WITH INTRAOPERATIVE CHOLANGIOGRAM;  Surgeon: Emelia LoronMatthew Wakefield, MD;  Location: [facility] ORS;  Service: General;  Laterality: N/A;  laparoscopic cholescystectomy with cholangiogram   Family History: family history is not on file. Social History:  reports that she quit smoking about 16 years ago. She does not have any smokeless tobacco history on file. She reports that she drinks alcohol. She reports that she does not use drugs.     Maternal Diabetes: No Genetic Screening: Declined Maternal Ultrasounds/Referrals: Normal Fetal Ultrasounds or other Referrals:  None Maternal Substance Abuse:  No Significant Maternal Medications:  None Significant Maternal Lab Results:  Lab values include: Group B Strep negative Other Comments:  Morbid Obesity  Review of Systems  Eyes: Negative.   Gastrointestinal: Negative for abdominal pain.  Neurological: Negative for headaches.   Maternal Medical History:  Fetal activity: Perceived fetal activity is normal.    Prenatal Complications - Diabetes: none.    Dilation: 1 Effacement (%): 50 Station: -2 Exam by:: Earlene Plateravis, RN Blood pressure (!) 146/83, pulse 76, temperature 99 F (37.2 C), temperature source Oral, resp. rate 18, height 5\' 7"  (1.702 m),  weight 134.3 kg (296 lb 1 oz). Maternal Exam:  Uterine Assessment: Contraction frequency is rare.   Abdomen: Estimated fetal weight is 7 lbs 4 oz at 38 weeks.   Fetal presentation: vertex  Introitus: Normal vulva. Pelvis: adequate for delivery.   Cervix: Cervix evaluated by digital exam.   closed  Fetal Exam Fetal Monitor Review: Mode: fetoscope.   Baseline rate: 130s.  Variability: moderate (6-25 bpm).   Pattern: accelerations present and prolonged decelerations.    Fetal State Assessment: Category I - tracings are normal. Reassuring fetal tracing.  Physical Exam  Constitutional: She is oriented to person, place, and time. She appears well-developed and well-nourished.  HENT:  Head: Normocephalic and atraumatic.  Eyes: EOM are normal.  Neck: Normal range of motion.  Respiratory: Effort normal. No respiratory distress.  GI: Soft. There is no tenderness.  Musculoskeletal: She exhibits edema. She exhibits no tenderness.  Neurological: She is alert and oriented to person, place, and time. She has normal reflexes.  Skin: Skin is warm and dry.  Psychiatric: She has a normal mood and affect.    Prenatal labs: ABO, Rh: --/--/B POS (04/17 1050) Antibody: PENDING (04/17 1050) Rubella:   RPR:    HBsAg:    HIV:    GBS:   Neg  Assessment/Plan: IUP @ 40 5/7 weeks Post dates induction Reassuring fetal tracing. Cytotec for cervical ripening.  Elevated BP-GHTN vs. Preeclampsia.  F/u CMP and PCR.  D/w pt.  Geryl Rankins 04/25/2017, 1:35 PM

## 2017-04-25 NOTE — Progress Notes (Signed)
Samantha Jacobs is a 37 y.o. G1P0 at 2582w5d   Subjective: Pt without complaints.  No headaches, visual changes. Not feeling contractions.  Objective: BP (!) 137/92   Pulse 83   Temp 99 F (37.2 C) (Oral)   Resp 18   Ht 5\' 7"  (1.702 m)   Wt 134.3 kg (296 lb 1 oz)   BMI 46.37 kg/m  No intake/output data recorded. No intake/output data recorded. Gen:   NAD FHT:  FHR: 120s bpm, variability: moderate,  accelerations:  Present,  decelerations:  Present Occ late deceleration UC:   irregular, every 2-4 minutes SVE:   Dilation: 1 Effacement (%): 40 Station: -2 Exam by:: Davis,RN 1/50/-2, well applied to cervix  Foley bulb inserted.  Initially attempted with betadine on Foley bulb manually but catheter would not pass through internal os easily.  Speculum and ringed forceps used to advance catheter easily.  60 ml sterile fluid inserted.  Labs: Lab Results  Component Value Date   WBC 7.1 04/25/2017   HGB 11.2 (L) 04/25/2017   HCT 33.1 (L) 04/25/2017   MCV 78.3 04/25/2017   PLT 282 04/25/2017   CMP     Component Value Date/Time   NA 136 04/25/2017 1040   K 4.4 04/25/2017 1040   CL 107 04/25/2017 1040   CO2 18 (L) 04/25/2017 1040   GLUCOSE 84 04/25/2017 1040   BUN 9 04/25/2017 1040   CREATININE 0.62 04/25/2017 1040   CALCIUM 9.4 04/25/2017 1040   PROT 7.1 04/25/2017 1040   ALBUMIN 3.2 (L) 04/25/2017 1040   AST 24 04/25/2017 1040   ALT 22 04/25/2017 1040   ALKPHOS 180 (H) 04/25/2017 1040   BILITOT 0.2 (L) 04/25/2017 1040   GFRNONAA >60 04/25/2017 1040   GFRAA >60 04/25/2017 1040    PCR 0.09  Assessment / Plan: IUP @ 40 5/7 weeks Postdates Induction. Gestational HTN.  Labor: S/p Cytotec x 1.  Discontinued due to occasional decelerations.  Pt is responding well to Pitocin.  Continue low dose Pitocin, hold at 6 mUs.  Increase when Foley bulb is expelled. Preeclampsia:  Neg Preeclampsia. Fetal Wellbeing:  Category II Pain Control:  Plans for epidural in labor. I/D:   n/a Anticipated MOD:  NSVD   Dr. Jaymes GraffNaima Dillard assuming care from 7pm to 7 am.  Pt informed.  Samantha Jacobs 04/25/2017, 6:21 PM

## 2017-04-26 ENCOUNTER — Inpatient Hospital Stay (HOSPITAL_COMMUNITY): Payer: BLUE CROSS/BLUE SHIELD | Admitting: Anesthesiology

## 2017-04-26 ENCOUNTER — Encounter (HOSPITAL_COMMUNITY)
Admission: AD | Disposition: A | Discharge status: Home / Self Care | Payer: Self-pay | Source: Ambulatory Visit | Attending: Obstetrics and Gynecology

## 2017-04-26 ENCOUNTER — Encounter (HOSPITAL_COMMUNITY): Payer: Self-pay

## 2017-04-26 LAB — CBC
HEMATOCRIT: 32.4 % — AB (ref 36.0–46.0)
HEMOGLOBIN: 11 g/dL — AB (ref 12.0–15.0)
MCH: 26.8 pg (ref 26.0–34.0)
MCHC: 34 g/dL (ref 30.0–36.0)
MCV: 79 fL (ref 78.0–100.0)
Platelets: 249 10*3/uL (ref 150–400)
RBC: 4.1 MIL/uL (ref 3.87–5.11)
RDW: 14 % (ref 11.5–15.5)
WBC: 12.2 10*3/uL — AB (ref 4.0–10.5)

## 2017-04-26 LAB — SYPHILIS: RPR W/REFLEX TO RPR TITER AND TREPONEMAL ANTIBODIES, TRADITIONAL SCREENING AND DIAGNOSIS ALGORITHM: RPR Ser Ql: NONREACTIVE

## 2017-04-26 SURGERY — Surgical Case
Anesthesia: Regional

## 2017-04-26 MED ORDER — DIPHENHYDRAMINE HCL 25 MG PO CAPS: 25.0000 mg | ORAL_CAPSULE | Freq: Four times a day (QID) | ORAL | Status: DC | PRN

## 2017-04-26 MED ORDER — PHENYLEPHRINE 40 MCG/ML (10ML) SYRINGE FOR IV PUSH (FOR BLOOD PRESSURE SUPPORT)
80.0000 ug | PREFILLED_SYRINGE | INTRAVENOUS | Status: DC | PRN
  Filled 2017-04-26: qty 10

## 2017-04-26 MED ORDER — MORPHINE SULFATE (PF) 0.5 MG/ML IJ SOLN
INTRAMUSCULAR | Status: DC | PRN
  Administered 2017-04-26: 4 mg via EPIDURAL

## 2017-04-26 MED ORDER — LIDOCAINE-EPINEPHRINE (PF) 2 %-1:200000 IJ SOLN
INTRAMUSCULAR | Status: AC
  Filled 2017-04-26: qty 40

## 2017-04-26 MED ORDER — WITCH HAZEL-GLYCERIN EX PADS: 1.0000 "application " | MEDICATED_PAD | CUTANEOUS | Status: DC | PRN

## 2017-04-26 MED ORDER — IBUPROFEN 600 MG PO TABS
600.0000 mg | ORAL_TABLET | Freq: Four times a day (QID) | ORAL | Status: DC
  Administered 2017-04-27 – 2017-04-28 (×9): 600 mg via ORAL
  Filled 2017-04-26 (×10): qty 1

## 2017-04-26 MED ORDER — SENNOSIDES-DOCUSATE SODIUM 8.6-50 MG PO TABS
2.0000 | ORAL_TABLET | ORAL | Status: DC
  Administered 2017-04-27 (×2): 2 via ORAL
  Filled 2017-04-26 (×3): qty 2

## 2017-04-26 MED ORDER — KETOROLAC TROMETHAMINE 30 MG/ML IJ SOLN
INTRAMUSCULAR | Status: AC
  Administered 2017-04-26: 30 mg
  Filled 2017-04-26: qty 1

## 2017-04-26 MED ORDER — DIBUCAINE 1 % RE OINT: 1.0000 "application " | TOPICAL_OINTMENT | RECTAL | Status: DC | PRN

## 2017-04-26 MED ORDER — TETANUS-DIPHTH-ACELL PERTUSSIS 5-2.5-18.5 LF-MCG/0.5 IM SUSP: 0.5000 mL | Freq: Once | INTRAMUSCULAR | Status: DC

## 2017-04-26 MED ORDER — OXYTOCIN 10 UNIT/ML IJ SOLN
INTRAMUSCULAR | Status: AC
Start: 2017-04-26 — End: 2017-04-26
  Filled 2017-04-26: qty 4

## 2017-04-26 MED ORDER — MORPHINE SULFATE (PF) 0.5 MG/ML IJ SOLN
INTRAMUSCULAR | Status: AC
  Filled 2017-04-26: qty 10

## 2017-04-26 MED ORDER — SIMETHICONE 80 MG PO CHEW
80.0000 mg | CHEWABLE_TABLET | ORAL | Status: DC | PRN
Start: 2017-04-26 — End: 2017-04-29

## 2017-04-26 MED ORDER — OXYTOCIN 40 UNITS IN LACTATED RINGERS INFUSION - SIMPLE MED: 2.5000 [IU]/h | INTRAVENOUS | Status: AC

## 2017-04-26 MED ORDER — ZOLPIDEM TARTRATE 5 MG PO TABS: 5.0000 mg | ORAL_TABLET | Freq: Every evening | ORAL | Status: DC | PRN

## 2017-04-26 MED ORDER — FENTANYL 2.5 MCG/ML BUPIVACAINE 1/10 % EPIDURAL INFUSION (WH - ANES)
14.0000 mL/h | INTRAMUSCULAR | Status: DC | PRN
  Administered 2017-04-26: 14 mL/h via EPIDURAL
  Filled 2017-04-26: qty 100

## 2017-04-26 MED ORDER — METOCLOPRAMIDE HCL 5 MG/ML IJ SOLN: 10.0000 mg | Freq: Once | INTRAMUSCULAR | Status: DC | PRN

## 2017-04-26 MED ORDER — SCOPOLAMINE 1 MG/3DAYS TD PT72
MEDICATED_PATCH | TRANSDERMAL | Status: AC
  Filled 2017-04-26: qty 1

## 2017-04-26 MED ORDER — OXYTOCIN 10 UNIT/ML IJ SOLN
INTRAMUSCULAR | Status: DC | PRN
  Administered 2017-04-26: 40 [IU] via INTRAVENOUS

## 2017-04-26 MED ORDER — LACTATED RINGERS IV SOLN
INTRAVENOUS | Status: DC | PRN
  Administered 2017-04-26: 17:00:00 via INTRAVENOUS

## 2017-04-26 MED ORDER — LACTATED RINGERS IV SOLN
INTRAVENOUS | Status: DC
  Administered 2017-04-27 (×2): via INTRAVENOUS

## 2017-04-26 MED ORDER — PRENATAL MULTIVITAMIN CH
1.0000 | ORAL_TABLET | Freq: Every day | ORAL | Status: DC
  Administered 2017-04-27 – 2017-04-28 (×2): 1 via ORAL
  Filled 2017-04-26 (×2): qty 1

## 2017-04-26 MED ORDER — SIMETHICONE 80 MG PO CHEW
80.0000 mg | CHEWABLE_TABLET | Freq: Three times a day (TID) | ORAL | Status: DC
  Administered 2017-04-27 – 2017-04-28 (×6): 80 mg via ORAL
  Filled 2017-04-26 (×7): qty 1

## 2017-04-26 MED ORDER — COCONUT OIL OIL
1.0000 "application " | TOPICAL_OIL | Status: DC | PRN
  Administered 2017-04-27: 1 via TOPICAL
  Filled 2017-04-26: qty 120

## 2017-04-26 MED ORDER — SIMETHICONE 80 MG PO CHEW
80.0000 mg | CHEWABLE_TABLET | ORAL | Status: DC
  Administered 2017-04-27 – 2017-04-28 (×3): 80 mg via ORAL
  Filled 2017-04-26 (×3): qty 1

## 2017-04-26 MED ORDER — ONDANSETRON HCL 4 MG/2ML IJ SOLN
INTRAMUSCULAR | Status: AC
  Filled 2017-04-26: qty 4

## 2017-04-26 MED ORDER — DIPHENHYDRAMINE HCL 50 MG/ML IJ SOLN: 12.5000 mg | INTRAMUSCULAR | Status: DC | PRN

## 2017-04-26 MED ORDER — EPHEDRINE 5 MG/ML INJ: 10.0000 mg | INTRAVENOUS | Status: DC | PRN

## 2017-04-26 MED ORDER — MEPERIDINE HCL 25 MG/ML IJ SOLN: 6.2500 mg | INTRAMUSCULAR | Status: DC | PRN

## 2017-04-26 MED ORDER — LIDOCAINE HCL (PF) 1 % IJ SOLN
INTRAMUSCULAR | Status: DC | PRN
  Administered 2017-04-26 (×2): 5 mL

## 2017-04-26 MED ORDER — ONDANSETRON HCL 4 MG/2ML IJ SOLN
INTRAMUSCULAR | Status: DC | PRN
  Administered 2017-04-26: 4 mg via INTRAVENOUS

## 2017-04-26 MED ORDER — SODIUM BICARBONATE 8.4 % IV SOLN
INTRAVENOUS | Status: AC
  Filled 2017-04-26: qty 50

## 2017-04-26 MED ORDER — LACTATED RINGERS IV SOLN
500.0000 mL | Freq: Once | INTRAVENOUS | Status: AC
  Administered 2017-04-26: 500 mL via INTRAVENOUS

## 2017-04-26 MED ORDER — FENTANYL CITRATE (PF) 100 MCG/2ML IJ SOLN: 25.0000 ug | INTRAMUSCULAR | Status: DC | PRN

## 2017-04-26 MED ORDER — OXYCODONE HCL 5 MG PO TABS: 5.0000 mg | ORAL_TABLET | ORAL | Status: DC | PRN

## 2017-04-26 MED ORDER — LIDOCAINE-EPINEPHRINE (PF) 2 %-1:200000 IJ SOLN
INTRAMUSCULAR | Status: DC | PRN
  Administered 2017-04-26 (×3): 5 mL via EPIDURAL

## 2017-04-26 MED ORDER — LACTATED RINGERS IV SOLN: INTRAVENOUS | Status: DC

## 2017-04-26 MED ORDER — SCOPOLAMINE 1 MG/3DAYS TD PT72
MEDICATED_PATCH | TRANSDERMAL | Status: DC | PRN
  Administered 2017-04-26: 1 via TRANSDERMAL

## 2017-04-26 MED ORDER — CEFAZOLIN SODIUM 10 G IJ SOLR
INTRAMUSCULAR | Status: AC
  Filled 2017-04-26: qty 3000

## 2017-04-26 MED ORDER — MENTHOL 3 MG MT LOZG: 1.0000 | LOZENGE | OROMUCOSAL | Status: DC | PRN

## 2017-04-26 MED ORDER — OXYCODONE HCL 5 MG PO TABS: 10.0000 mg | ORAL_TABLET | ORAL | Status: DC | PRN

## 2017-04-26 MED ORDER — DEXTROSE 5 % IV SOLN
INTRAVENOUS | Status: DC | PRN
  Administered 2017-04-26: 3 g via INTRAVENOUS

## 2017-04-26 MED ORDER — ACETAMINOPHEN 325 MG PO TABS
650.0000 mg | ORAL_TABLET | ORAL | Status: DC | PRN
  Administered 2017-04-28 (×2): 650 mg via ORAL
  Filled 2017-04-26 (×3): qty 2

## 2017-04-26 MED ORDER — LIDOCAINE-EPINEPHRINE (PF) 2 %-1:200000 IJ SOLN
INTRAMUSCULAR | Status: AC
  Filled 2017-04-26: qty 20

## 2017-04-26 MED ORDER — PHENYLEPHRINE 40 MCG/ML (10ML) SYRINGE FOR IV PUSH (FOR BLOOD PRESSURE SUPPORT)
80.0000 ug | PREFILLED_SYRINGE | INTRAVENOUS | Status: AC | PRN
  Administered 2017-04-26 (×3): 80 ug via INTRAVENOUS

## 2017-04-26 SURGICAL SUPPLY — 42 items
BARRIER ADHS 3X4 INTERCEED (GAUZE/BANDAGES/DRESSINGS) ×3 IMPLANT
BENZOIN TINCTURE PRP APPL 2/3 (GAUZE/BANDAGES/DRESSINGS) ×3 IMPLANT
CHLORAPREP W/TINT 26ML (MISCELLANEOUS) ×3 IMPLANT
CLAMP CORD UMBIL (MISCELLANEOUS) IMPLANT
CLOSURE STERI-STRIP 1/2X4 (GAUZE/BANDAGES/DRESSINGS) ×1
CLOSURE WOUND 1/2 X4 (GAUZE/BANDAGES/DRESSINGS) ×1
CLOTH BEACON ORANGE TIMEOUT ST (SAFETY) ×3 IMPLANT
CLSR STERI-STRIP ANTIMIC 1/2X4 (GAUZE/BANDAGES/DRESSINGS) ×2 IMPLANT
DERMABOND ADVANCED (GAUZE/BANDAGES/DRESSINGS)
DERMABOND ADVANCED .7 DNX12 (GAUZE/BANDAGES/DRESSINGS) IMPLANT
DRSG OPSITE POSTOP 4X10 (GAUZE/BANDAGES/DRESSINGS) ×3 IMPLANT
ELECT REM PT RETURN 9FT ADLT (ELECTROSURGICAL) ×3
ELECTRODE REM PT RTRN 9FT ADLT (ELECTROSURGICAL) ×1 IMPLANT
EXTRACTOR VACUUM KIWI (MISCELLANEOUS) IMPLANT
GAUZE SPONGE 4X4 12PLY STRL LF (GAUZE/BANDAGES/DRESSINGS) ×6 IMPLANT
GLOVE BIOGEL PI IND STRL 6.5 (GLOVE) ×1 IMPLANT
GLOVE BIOGEL PI IND STRL 7.0 (GLOVE) ×2 IMPLANT
GLOVE BIOGEL PI INDICATOR 6.5 (GLOVE) ×2
GLOVE BIOGEL PI INDICATOR 7.0 (GLOVE) ×4
GLOVE ECLIPSE 6.5 STRL STRAW (GLOVE) ×3 IMPLANT
GOWN STRL REUS W/TWL LRG LVL3 (GOWN DISPOSABLE) ×9 IMPLANT
KIT ABG SYR 3ML LUER SLIP (SYRINGE) IMPLANT
NEEDLE HYPO 25X5/8 SAFETYGLIDE (NEEDLE) IMPLANT
NS IRRIG 1000ML POUR BTL (IV SOLUTION) ×3 IMPLANT
PACK C SECTION WH (CUSTOM PROCEDURE TRAY) ×3 IMPLANT
PAD ABD 7.5X8 STRL (GAUZE/BANDAGES/DRESSINGS) ×3 IMPLANT
PAD OB MATERNITY 4.3X12.25 (PERSONAL CARE ITEMS) ×3 IMPLANT
PENCIL SMOKE EVAC W/HOLSTER (ELECTROSURGICAL) ×3 IMPLANT
RTRCTR C-SECT PINK 25CM LRG (MISCELLANEOUS) ×3 IMPLANT
STRIP CLOSURE SKIN 1/2X4 (GAUZE/BANDAGES/DRESSINGS) ×2 IMPLANT
SUT MON AB 4-0 PS1 27 (SUTURE) ×3 IMPLANT
SUT PLAIN 0 NONE (SUTURE) IMPLANT
SUT PLAIN 2 0 XLH (SUTURE) IMPLANT
SUT VIC AB 0 CT1 27 (SUTURE) ×4
SUT VIC AB 0 CT1 27XBRD ANBCTR (SUTURE) ×2 IMPLANT
SUT VIC AB 0 CTX 36 (SUTURE) ×6
SUT VIC AB 0 CTX36XBRD ANBCTRL (SUTURE) ×3 IMPLANT
SUT VIC AB 2-0 CT1 27 (SUTURE) ×2
SUT VIC AB 2-0 CT1 TAPERPNT 27 (SUTURE) ×1 IMPLANT
SUT VIC AB 4-0 KS 27 (SUTURE) ×3 IMPLANT
TOWEL OR 17X24 6PK STRL BLUE (TOWEL DISPOSABLE) ×3 IMPLANT
TRAY FOLEY W/BAG SLVR 14FR LF (SET/KITS/TRAYS/PACK) IMPLANT

## 2017-04-26 NOTE — Anesthesia Preprocedure Evaluation (Addendum)
Anesthesia Evaluation  Patient identified by MRN, date of birth, ID band Patient awake    Reviewed: Allergy & Precautions, H&P , NPO status , Patient's Chart, lab work & pertinent test results  History of Anesthesia Complications Negative for: history of anesthetic complications  Airway Mallampati: II  TM Distance: >3 FB Neck ROM: full    Dental no notable dental hx. (+) Teeth Intact   Pulmonary neg pulmonary ROS, former smoker,    Pulmonary exam normal breath sounds clear to auscultation       Cardiovascular hypertension (PIH), Normal cardiovascular exam Rhythm:regular Rate:Normal     Neuro/Psych negative neurological ROS  negative psych ROS   GI/Hepatic negative GI ROS, Neg liver ROS,   Endo/Other  Morbid obesity  Renal/GU negative Renal ROS  negative genitourinary   Musculoskeletal   Abdominal   Peds  Hematology negative hematology ROS (+)   Anesthesia Other Findings   Reproductive/Obstetrics (+) Pregnancy                             Anesthesia Physical Anesthesia Plan  ASA: III and emergent  Anesthesia Plan: Epidural   Post-op Pain Management:    Induction:   PONV Risk Score and Plan:   Airway Management Planned:   Additional Equipment:   Intra-op Plan:   Post-operative Plan:   Informed Consent: I have reviewed the patients History and Physical, chart, labs and discussed the procedure including the risks, benefits and alternatives for the proposed anesthesia with the patient or authorized representative who has indicated his/her understanding and acceptance.     Plan Discussed with:   Anesthesia Plan Comments: (Prolapsed cord. Stat C/S. Use existing labor epidural)       Anesthesia Quick Evaluation

## 2017-04-26 NOTE — Op Note (Signed)
PreOp Diagnosis: 1) Intrauterine pregnancy @ 4130w6d 2) Cord prolapse PostOp Diagnosis: same Procedure: Primary C-section Surgeon: Dr. Myna HidalgoJennifer Aryan Sparks Assistant: Dr. Marylou FlesherBenita Varnado Anesthesia: epidural Complications: none EBL: 700cc UOP: 200cc Fluids: 1400c  INDICATIONS: At bedside for evaluation for Cat. II tracing.  Upon SVE- spontaneous rupture with meconium- cord prolapse appreciated.  Cord attempted to be reduced and fetal head was held up while pt was taken back to the operating room.  Stat C-section discussed with patient including risk of bleeding, infection and injury.  Verbal consent obtained  Findings: Female infant from vertex presentation, normal uterus, tubes and ovaries bilaterally.  PROCEDURE:  Informed consent was obtained from the patient with risks, benefits, complications, treatment options, and expected outcomes discussed with the patient.  The patient concurred with the proposed plan, giving informed consent with form signed.   The patient was taken to Operating Room, and identified with the procedure verified as C-Section Delivery with Time Out. With induction of anesthesia, the patient was prepped and draped in the usual sterile fashion. A Pfannenstiel incision was made and carried down through the subcutaneous tissue to the fascia. The fascia was incised in the midline and extended transversely.  The peritoneum was identified and entered bluntly.  The bladder flap was placed.  A low transverse uterine incision was made and the infants head delivered atraumatically. After the umbilical cord was clamped and cut cord blood was obtained for evaluation. The placenta was removed intact and appeared normal.  The Alexis retractor was placed.  The uterine outline, tubes and ovaries appeared normal. The uterine incision was closed with running locked sutures of 0 Vicryl and a second layer of the same stitch was used in an imbricating fashion.  Excellent hemostasis was obtained.  The  pericolic gutters were then cleared of all clots and debris. Interceed was placed over the uterine incision.  The peritoneum was closed in a running fashion.  The fascia was then reapproximated with running sutures of 0 Vicryl. The subcutaneous tissue was reapproximated with 2-0 plain gut suture.   The skin was closed with 4-0 vicryl in a subcuticular fashion.  Instrument, sponge, and needle counts were correct prior the abdominal closure and at the conclusion of the case. The patient was taken to recovery in stable condition.  Myna HidalgoJennifer Livan Hires, DO [phone] (cell) [phone] (office)

## 2017-04-26 NOTE — Transfer of Care (Signed)
Immediate Anesthesia Transfer of Care Note  Patient: Samantha Jacobs  Procedure(s) Performed: CESAREAN SECTION (N/A )  Patient Location: PACU  Anesthesia Type:Epidural  Level of Consciousness: awake, alert  and oriented  Airway & Oxygen Therapy: Patient Spontanous Breathing  Post-op Assessment: Report given to RN and Post -op Vital signs reviewed and stable  Post vital signs: Reviewed and stable  Last Vitals:  Vitals Value Taken Time  BP    Temp    Pulse    Resp    SpO2      Last Pain:  Vitals:   04/26/17 1524  TempSrc: Oral  PainSc:          Complications: No apparent anesthesia complications

## 2017-04-26 NOTE — Anesthesia Procedure Notes (Signed)
Epidural Patient location during procedure: OB  Staffing Anesthesiologist: Natividad Schlosser, MD Performed: anesthesiologist   Preanesthetic Checklist Completed: patient identified, site marked, surgical consent, pre-op evaluation, timeout performed, IV checked, risks and benefits discussed and monitors and equipment checked  Epidural Patient position: sitting Prep: DuraPrep Patient monitoring: heart rate, continuous pulse ox and blood pressure Approach: right paramedian Location: L3-L4 Injection technique: LOR saline  Needle:  Needle type: Tuohy  Needle gauge: 17 G Needle length: 9 cm and 9 Needle insertion depth: 7 cm Catheter type: closed end flexible Catheter size: 20 Guage Catheter at skin depth: 12 cm Test dose: negative  Assessment Events: blood not aspirated, injection not painful, no injection resistance, negative IV test and no paresthesia  Additional Notes Patient identified. Risks/Benefits/Options discussed with patient including but not limited to bleeding, infection, nerve damage, paralysis, failed block, incomplete pain control, headache, blood pressure changes, nausea, vomiting, reactions to medication both or allergic, itching and postpartum back pain. Confirmed with bedside nurse the patient's most recent platelet count. Confirmed with patient that they are not currently taking any anticoagulation, have any bleeding history or any family history of bleeding disorders. Patient expressed understanding and wished to proceed. All questions were answered. Sterile technique was used throughout the entire procedure. Please see nursing notes for vital signs. Test dose was given through epidural needle and negative prior to continuing to dose epidural or start infusion. Warning signs of high block given to the patient including shortness of breath, tingling/numbness in hands, complete motor block, or any concerning symptoms with instructions to call for help. Patient was given  instructions on fall risk and not to get out of bed. All questions and concerns addressed with instructions to call with any issues.     

## 2017-04-26 NOTE — Progress Notes (Signed)
Patient ID: Samantha Jacobs, female   DOB: January 05, 1981, 37 y.o.   MRN: [id number]   Overnight, pt had prolonged and late decels.  Pitocin stopped.  Baby became Category I. Pt states she is having painful contractions.  Requests pain medication.  BP (!) 144/76   Pulse 69   Temp 98.7 F (37.1 C) (Oral)   Resp 16   Ht 5\' 7"  (1.702 m)   Wt 134.3 kg (296 lb 1 oz)   SpO2 97%   BMI 46.37 kg/m   Gen:  NAD, discomfort with contractions, breathing through contractions Cervix:  Foley bulb still in place.  Old blood noted.  Cervix feels about 3 cm  Cat I tracing. Contractions q 3-5 min  A/P Postdates induction. Foley bulb in place x 13 hours.  Restart Pitocin.  Increase 1 x 1. Overall reassuring fetal status currently.  Discussed fetal intolerance to labor with pt. GHTN- No s/sxs of Preeclampsia. Dr. Charlotta Newtonzan covering from present until 5 pm.  Pt aware.

## 2017-04-27 ENCOUNTER — Encounter (HOSPITAL_COMMUNITY): Payer: Self-pay | Admitting: *Deleted

## 2017-04-27 LAB — CBC
HCT: 26.2 % — ABNORMAL LOW (ref 36.0–46.0)
Hemoglobin: 8.6 g/dL — ABNORMAL LOW (ref 12.0–15.0)
MCH: 26 pg (ref 26.0–34.0)
MCHC: 32.8 g/dL (ref 30.0–36.0)
MCV: 79.2 fL (ref 78.0–100.0)
PLATELETS: 234 10*3/uL (ref 150–400)
RBC: 3.31 MIL/uL — AB (ref 3.87–5.11)
RDW: 14 % (ref 11.5–15.5)
WBC: 11.7 10*3/uL — ABNORMAL HIGH (ref 4.0–10.5)

## 2017-04-27 MED ORDER — SCOPOLAMINE 1 MG/3DAYS TD PT72
1.0000 | MEDICATED_PATCH | Freq: Once | TRANSDERMAL | Status: DC
  Filled 2017-04-27: qty 1

## 2017-04-27 MED ORDER — NALBUPHINE HCL 10 MG/ML IJ SOLN: 5.0000 mg | Freq: Once | INTRAMUSCULAR | Status: DC | PRN

## 2017-04-27 MED ORDER — KETOROLAC TROMETHAMINE 30 MG/ML IJ SOLN: 30.0000 mg | Freq: Four times a day (QID) | INTRAMUSCULAR | Status: AC | PRN

## 2017-04-27 MED ORDER — DIPHENHYDRAMINE HCL 25 MG PO CAPS: 25.0000 mg | ORAL_CAPSULE | ORAL | Status: DC | PRN

## 2017-04-27 MED ORDER — DIPHENHYDRAMINE HCL 50 MG/ML IJ SOLN: 12.5000 mg | INTRAMUSCULAR | Status: DC | PRN

## 2017-04-27 MED ORDER — NALBUPHINE HCL 10 MG/ML IJ SOLN: 5.0000 mg | INTRAMUSCULAR | Status: DC | PRN

## 2017-04-27 MED ORDER — DEXTROSE 5 % IV SOLN
1.0000 ug/kg/h | INTRAVENOUS | Status: DC | PRN
  Filled 2017-04-27: qty 5

## 2017-04-27 MED ORDER — FERROUS SULFATE 325 (65 FE) MG PO TABS
325.0000 mg | ORAL_TABLET | Freq: Every day | ORAL | Status: DC
  Administered 2017-04-28: 325 mg via ORAL
  Filled 2017-04-27 (×2): qty 1

## 2017-04-27 MED ORDER — NALOXONE HCL 0.4 MG/ML IJ SOLN: 0.4000 mg | INTRAMUSCULAR | Status: DC | PRN

## 2017-04-27 MED ORDER — ONDANSETRON HCL 4 MG/2ML IJ SOLN
4.0000 mg | Freq: Three times a day (TID) | INTRAMUSCULAR | Status: DC | PRN
  Filled 2017-04-27: qty 2

## 2017-04-27 MED ORDER — SODIUM CHLORIDE 0.9% FLUSH: 3.0000 mL | INTRAVENOUS | Status: DC | PRN

## 2017-04-27 MED ORDER — ACETAMINOPHEN 500 MG PO TABS
1000.0000 mg | ORAL_TABLET | Freq: Four times a day (QID) | ORAL | Status: AC
  Administered 2017-04-27 (×4): 1000 mg via ORAL
  Filled 2017-04-27 (×4): qty 2

## 2017-04-27 NOTE — Lactation Note (Signed)
This note was copied from a baby's chart. Lactation Consultation Note Baby 8 hrs old. Sleeping in crib. Mom stated the last feeding was good.  Mom has large soft pendulous breast w/semi flat (hardly any shaft) nipple. Everts slightly w/stimulation. Hand pump given to pre-pump to evert prior to feeding. Shells given to wear in bra. Hand expression w/glisten of colostrum.   RN fitted mom w/#20NS which fit well. Discussed w/mom may or may not stay on breast. Would have to try and see. Taught "C" hold, encouraged for latching. Mom demonstrated NS application several times.   Newborn behavior, feeding habits, STS, I&O, cluster feeding, and supply and demand. Mom encouraged to feed baby 8-12 times/24 hours and with feeding cues. Hand expression encouraged. Mom is to call for assistance or questions. WH/LC brochure given w/resources, support groups and LC services.  Patient Name: Samantha Jacobs Today's Date: 04/27/2017 Reason for consult: Initial assessment   Maternal Data Has patient been taught Hand Expression?: Yes Does the patient have breastfeeding experience prior to this delivery?: No  Feeding Feeding Type: Breast Fed Length of feed: 12 min  LATCH Score Latch: Grasps breast easily, tongue down, lips flanged, rhythmical sucking.  Audible Swallowing: A few with stimulation  Type of Nipple: Flat  Comfort (Breast/Nipple): Soft / non-tender  Hold (Positioning): Assistance needed to correctly position infant at breast and maintain latch.  LATCH Score: 7  Interventions Interventions: Breast feeding basics reviewed;Support pillows;Position options;Skin to skin;Breast massage;Shells;Hand express;Pre-pump if needed;Hand pump;Breast compression  Lactation Tools Discussed/Used Tools: Shells;Pump Nipple shield size: 20 Shell Type: Inverted Breast pump type: Manual WIC Program: No Pump Review: Setup, frequency, and cleaning;Milk Storage Initiated by:: Peri JeffersonL. Javeria Briski RN IBCLC Date  initiated:: 04/27/17   Consult Status Consult Status: Follow-up Date: 04/27/17 Follow-up type: In-patient    Haneef Hallquist, Diamond NickelLAURA G 04/27/2017, 1:33 AM

## 2017-04-27 NOTE — Lactation Note (Signed)
This note was copied from a baby's chart. Lactation Consultation Note  Patient Name: Samantha Claudina LickCrystal Craigo Today's Date: 04/27/2017 Reason for consult: Initial assessment;1st time breastfeeding;Primapara;Term  G1P1 mother whose infant is now 1924 hours of age.  RN had mentioned that mother's nipples were sore and asked if I could follow up for an assessment.  Upon entering room, infant had just finished feeding.  Mother's nipples are cracked and bleeding.  The right nipple has more bleeding than the left and it is also more tender.  Mother has not been feeding off the right side due to the pain.  She has comfort gels, coconut oil and shells and states she is using her products.  RN increased the NS size from #20 to #24.  LC assisted the infant to latch on the left breast with the nipple shield.  Infant's lips are flanged and he is actively sucking.  After approximately 15 minutes he pulled off the NS.  There is no colostrum in the NS.  Spoke with the parents about possibly supplementing with the next feed.  LC wants to start mother pumping with the DEBP for stimulation and to see if she can get any colostrum to feed the baby.  Parents were made aware that formula may have to be used soon for nutritional purposes and this will be determined with the next feeding.  Parents are agreeable to using formula if necessary.  RN updated with this plan.  DEBP set up and flange size evaluated to be appropriate with a #24.  Mother states no pain during pumping.  Informed mother that it is okay for the infant to swallow a little blood and that it will not hurt the colostrum or the baby.  Encouraged continued breast massage, hand expression before and after feeding and to awaken infant to feed if he is not showing feeding cues by 3 hours.  Mother verbalized understanding.  Encouraged mother to use expressed colostrum and coconut oil for lubricant after feeds and to continue using the comfort gels.  No further  questions/concerns at this time and mother will keep RN updated.    Maternal Data Formula Feeding for Exclusion: No Has patient been taught Hand Expression?: Yes Does the patient have breastfeeding experience prior to this delivery?: No  Feeding Feeding Type: Breast Fed Length of feed: 15 min  LATCH Score Latch: Grasps breast easily, tongue down, lips flanged, rhythmical sucking.  Audible Swallowing: None  Type of Nipple: Flat(Semi flat and short shaft;breakdown noted)  Comfort (Breast/Nipple): Filling, red/small blisters or bruises, mild/mod discomfort  Hold (Positioning): Assistance needed to correctly position infant at breast and maintain latch.  LATCH Score: 5  Interventions Interventions: Breast feeding basics reviewed;Assisted with latch;Skin to skin;Breast massage;Hand express;Position options;Support pillows;Adjust position;Breast compression;Shells;Coconut oil;Comfort gels;Hand pump;DEBP  Lactation Tools Discussed/Used Tools: Shells;Pump;Flanges;Coconut oil;Comfort gels;Nipple Shields Nipple shield size: 24(per RN) Flange Size: 24 Shell Type: Inverted Breast pump type: Double-Electric Breast Pump;Manual WIC Program: No Pump Review: Setup, frequency, and cleaning;Milk Storage Initiated by:: Woodie Degraffenreid Date initiated:: 04/28/17   Consult Status Consult Status: Follow-up Date: 04/28/17 Follow-up type: In-patient    Dora SimsBeth R Malakhai Beitler 04/27/2017, 6:35 PM

## 2017-04-27 NOTE — Progress Notes (Signed)
Postop Note Day # 1  S:  Patient resting comfortable in bed.  Pain controlled.  Tolerating general diet. + flatus, no BM.  Lochia appropriate.  Ambulating without difficulty.  She denies n/v/f/c, SOB, or CP.  Pt plans on breastfeeding.  Foley in place.  O: Temp:  [97.9 F (36.6 C)-99.2 F (37.3 C)] 98.7 F (37.1 C) (04/19 0541) Pulse Rate:  [63-119] 79 (04/19 0541) Resp:  [15-25] 16 (04/19 0541) BP: (73-144)/(44-84) 105/63 (04/19 0541) SpO2:  [97 %-100 %] 98 % (04/19 0541)   Gen: A&Ox3, NAD CV: RRR Resp: CTAB Abdomen: soft, NT, ND +BS Uterus: firm, non-tender, below umbilicus Incision: bandage saturated- dressing removed- incision C/D/I- steris and honeycomb dressing replaced Ext: No edema, no calf tenderness bilaterally, SCDs in place  Labs:  CBC Latest Ref Rng & Units 04/27/2017 04/26/2017 04/25/2017  WBC 4.0 - 10.5 K/uL 11.7(H) 12.2(H) 7.1  Hemoglobin 12.0 - 15.0 g/dL 1.6(X8.6(L) 11.0(L) 11.2(L)  Hematocrit 36.0 - 46.0 % 26.2(L) 32.4(L) 33.1(L)  Platelets 150 - 400 K/uL 234 249 282    A/P: Pt is a 37 y.o. G1P1001 s/p primary C-section, POD#1  - Pain well controlled -GU: UOP is adequate, plan to remove foley this am -GI: Tolerating general diet -Activity: encouraged sitting up to chair and ambulation as tolerated -Prophylaxis: early ambulation, SCDs while in bed -Labs: expected decline due to C-section, plan for iron daily -baby boy circ completed  DISPO: Continue with routine postpartum care  Myna HidalgoJennifer Roseann Kees, DO [phone] (pager) [phone] (office)

## 2017-04-27 NOTE — Anesthesia Postprocedure Evaluation (Signed)
Anesthesia Post Note  Patient: [name]  Procedure(s) Performed: CESAREAN SECTION (N/A )     Patient location during evaluation: Mother Baby Anesthesia Type: Epidural Level of consciousness: awake Pain management: satisfactory to patient Vital Signs Assessment: post-procedure vital signs reviewed and stable Respiratory status: spontaneous breathing Cardiovascular status: stable Anesthetic complications: no    Last Vitals:  Vitals:   04/27/17 0541 04/27/17 0810  BP: 105/63 (!) 114/55  Pulse: 79 79  Resp: 16 18  Temp: 37.1 C 37.1 C  SpO2: 98% 100%    Last Pain:  Vitals:   04/27/17 0810  TempSrc: Oral  PainSc: 0-No pain   Pain Goal:                 KeyCorpBURGER,[name]

## 2017-04-28 NOTE — Progress Notes (Signed)
Postop Note Day # 2  S:  Patient resting comfortable in bed.  Pain controlled.  Tolerating general diet. + flatus, no BM.  Lochia appropriate.  Ambulating without difficulty.  She denies n/v/f/c, SOB, or CP.  Pt plans on breastfeeding.  Voiding freely.  O: Temp:  [97.5 F (36.4 C)-99.1 F (37.3 C)] 97.5 F (36.4 C) (04/20 0517) Pulse Rate:  [72-89] 72 (04/20 0517) Resp:  [18-20] 18 (04/20 0517) BP: (114-141)/(55-80) 141/80 (04/20 0517) SpO2:  [100 %] 100 % (04/19 1150)   Gen: A&Ox3, NAD CV: RRR Resp: CTAB Abdomen: soft, NT, ND +BS Uterus: firm, non-tender, below umbilicus Incision: honeycomb dressing C/D/I Ext: No edema, no calf tenderness bilaterally, SCDs in place  Labs:  CBC Latest Ref Rng & Units 04/27/2017 04/26/2017 04/25/2017  WBC 4.0 - 10.5 K/uL 11.7(H) 12.2(H) 7.1  Hemoglobin 12.0 - 15.0 g/dL 4.0(J8.6(L) 11.0(L) 11.2(L)  Hematocrit 36.0 - 46.0 % 26.2(L) 32.4(L) 33.1(L)  Platelets 150 - 400 K/uL 234 249 282    A/P: Pt is a 37 y.o. G1P1001 s/p primary C-section, POD#2  - Pain well controlled -GU: Voiding freely -GI: Tolerating general diet -Activity: encouraged sitting up to chair and ambulation as tolerated -Prophylaxis: early ambulation, SCDs while in bed -Labs: expected decline due to C-section, plan for iron daily -baby boy circ completed  DISPO: Continue with routine postpartum care, possible early discharge pending baby's status  Myna HidalgoJennifer Hadriel Northup, DO [phone] (pager) [phone] (office)

## 2017-04-29 MED ORDER — OXYCODONE HCL 5 MG PO TABS: 5.0000 mg | ORAL_TABLET | Freq: Four times a day (QID) | ORAL | 0 refills | Status: DC | PRN

## 2017-04-29 MED ORDER — IBUPROFEN 600 MG PO TABS: 600.0000 mg | ORAL_TABLET | Freq: Four times a day (QID) | ORAL | 0 refills | Status: DC

## 2017-04-29 MED ORDER — DOCUSATE SODIUM 100 MG PO CAPS: 100.0000 mg | ORAL_CAPSULE | Freq: Two times a day (BID) | ORAL | 0 refills | Status: DC

## 2017-04-29 NOTE — Discharge Summary (Signed)
OB Discharge Summary     Patient Name: Samantha LickCrystal Schiefelbein DOB: Feb 24, 1980 MRN: [id number]  Date of admission: 04/25/2017 Delivering MD: Myna HidalgoZAN, Mikeala Girdler   Date of discharge: 04/29/2017  Admitting diagnosis: INDUCTION Intrauterine pregnancy: 568w6d     Secondary diagnosis:  Active Problems:   Supervision of low-risk first pregnancy  Additional problems: Cord prolapse     Discharge diagnosis: Term Pregnancy Delivered                                                                                                Post partum procedures:none  Augmentation: Pitocin, Cytotec and Foley Balloon  Complications: None  Hospital course:  Induction of Labor With Cesarean Section  37 y.o. yo G1P1001 at 4468w6d was admitted to the hospital 04/25/2017 for induction of labor. Patient had a labor course significant for cord prolapse. The patient went for cesarean section due to Cord Prolapse, and delivered a Viable infant,04/26/2017  Membrane Rupture Time/Date: 4:33 PM ,04/26/2017   Details of operation can be found in separate operative Note.  Patient had an uncomplicated postpartum course. She is ambulating, tolerating a regular diet, passing flatus, and urinating well.  Patient is discharged home in stable condition on 04/29/17.                                    Physical exam  Vitals:   04/27/17 1645 04/28/17 0517 04/28/17 1729 04/29/17 0630  BP: 128/75 (!) 141/80 140/81 (!) 144/85  Pulse: 89 72 88 88  Resp: 18 18 18 18   Temp: 98.8 F (37.1 C) (!) 97.5 F (36.4 C) 98 F (36.7 C) 98.3 F (36.8 C)  TempSrc: Oral Oral Oral Oral  SpO2:   100%   Weight:      Height:       General: alert, cooperative and no distress Lochia: appropriate Uterine Fundus: firm Incision: Dressing is clean, dry, and intact DVT Evaluation: No evidence of DVT seen on physical exam. Labs: Lab Results  Component Value Date   WBC 11.7 (H) 04/27/2017   HGB 8.6 (L) 04/27/2017   HCT 26.2 (L) 04/27/2017   MCV 79.2 04/27/2017    PLT 234 04/27/2017   CMP Latest Ref Rng & Units 04/25/2017  Glucose 65 - 99 mg/dL 84  BUN 6 - 20 mg/dL 9  Creatinine 4.090.44 - 8.111.00 mg/dL 9.140.62  Sodium 782135 - 956145 mmol/L 136  Potassium 3.5 - 5.1 mmol/L 4.4  Chloride 101 - 111 mmol/L 107  CO2 22 - 32 mmol/L 18(L)  Calcium 8.9 - 10.3 mg/dL 9.4  Total Protein 6.5 - 8.1 g/dL 7.1  Total Bilirubin 0.3 - 1.2 mg/dL 2.1(H0.2(L)  Alkaline Phos 38 - 126 U/L 180(H)  AST 15 - 41 U/L 24  ALT 14 - 54 U/L 22    Discharge instruction: per After Visit Summary and "Baby and Me Booklet".  After visit meds:  Allergies as of 04/29/2017   No Known Allergies     Medication List    TAKE these medications   docusate  sodium 100 MG capsule Commonly known as:  COLACE Take 1 capsule (100 mg total) by mouth 2 (two) times daily.   ibuprofen 600 MG tablet Commonly known as:  ADVIL,MOTRIN Take 1 tablet (600 mg total) by mouth every 6 (six) hours.   INTEGRA 62.5-62.5-40-3 MG Caps TK 1 C PO QD   oxyCODONE 5 MG immediate release tablet Commonly known as:  Oxy IR/ROXICODONE Take 1 tablet (5 mg total) by mouth every 6 (six) hours as needed for moderate pain or severe pain (pain scale 4-7).   prenatal multivitamin Tabs tablet Take 1 tablet by mouth daily at 12 noon.       Diet: routine diet  Activity: Advance as tolerated. Pelvic rest for 6 weeks.   Outpatient follow up:2 weeks Follow up Appt:No future appointments. Follow up Visit:No follow-ups on file.  Postpartum contraception: Not Discussed  Newborn Data: Live born female  Birth Weight: 8 lb 5.2 oz (3775 g) APGAR: 8, 9  Newborn Delivery   Birth date/time:  04/26/2017 16:42:00 Delivery type:  C-Section, Low Transverse Trial of labor:  No C-section categorization:  Primary     Baby Feeding: Bottle and Breast Disposition: to be determined  04/29/2017 Sharon Seller, DO

## 2017-04-29 NOTE — Discharge Instructions (Signed)
Cesarean Delivery, Care After °Refer to this sheet in the next few weeks. These instructions provide you with information about caring for yourself after your procedure. Your health care provider may also give you more specific instructions. Your treatment has been planned according to current medical practices, but problems sometimes occur. Call your health care provider if you have any problems or questions after your procedure. °What can I expect after the procedure? °After the procedure, it is common to have: °· A small amount of blood or clear fluid coming from the incision. °· Some redness, swelling, and pain in your incision area. °· Some abdominal pain and soreness. °· Vaginal bleeding (lochia). °· Pelvic cramps. °· Fatigue. ° °Follow these instructions at home: °Incision care ° °· Follow instructions from your health care provider about how to take care of your incision. Make sure you: °? Wash your hands with soap and water before you change your bandage (dressing). If soap and water are not available, use hand sanitizer. °? If you have a dressing, change it as told by your health care provider. °? Leave stitches (sutures), skin staples, skin glue, or adhesive strips in place. These skin closures may need to stay in place for 2 weeks or longer. If adhesive strip edges start to loosen and curl up, you may trim the loose edges. Do not remove adhesive strips completely unless your health care provider tells you to do that. °· Check your incision area every day for signs of infection. Check for: °? More redness, swelling, or pain. °? More fluid or blood. °? Warmth. °? Pus or a bad smell. °· When you cough or sneeze, hug a pillow. This helps with pain and decreases the chance of your incision opening up (dehiscing). Do this until your incision heals. °Medicines °? Take over-the-counter and prescription medicines only as told by your health care provider.  For pain you may alternate between Ibuprofen and  percocet. Percocet may cause constipation, be sure to take stool softener twice daily along with this medication. °·  °· If you were prescribed an antibiotic medicine, take it as told by your health care provider. Do not stop taking the antibiotic until it is finished. °Driving °· Do not drive or operate heavy machinery while taking prescription pain medicine. °Lifestyle °· Do not drink alcohol. This is especially important if you are breastfeeding or taking pain medicine. °· Do not use tobacco products, including cigarettes, chewing tobacco, or e-cigarettes. If you need help quitting, ask your health care provider. Tobacco can delay wound healing. °Eating and drinking °· Drink at least 8 eight-ounce glasses of water every day unless told not to by your health care provider. If you breastfeed, you may need to drink more water than this. °· Eat high-fiber foods every day. These foods may help prevent or relieve constipation. High-fiber foods include: °? Whole grain cereals and breads. °? Brown rice. °? Beans. °? Fresh fruits and vegetables. °Activity °· Return to your normal activities as told by your health care provider. Ask your health care provider what activities are safe for you. °· Rest as much as possible. Try to rest or take a nap while your baby is sleeping. °· Do not lift anything that is heavier than your baby or 10 lb (4.5 kg) as told by your health care provider. °· Ask your health care provider when you can engage in sexual activity. This may depend on your: °? Risk of infection. °? Healing rate. °? Comfort and desire to   engage in sexual activity. °Bathing °· Do not take baths, swim, or use a hot tub until your health care provider approves. Ask your health care provider if you can take showers. You may only be allowed to take sponge baths until your incision heals. °General instructions °· Do not use tampons or douches until your health care provider approves. °· Wear: °? Loose, comfortable  clothing. °? A supportive and well-fitting bra. °· Watch for any blood clots that may pass from your vagina. These may look like clumps of dark red, brown, or black discharge. °· Keep your perineum clean and dry as told by your health care provider. °· Wipe from front to back when you use the toilet. °· If possible, have someone help you care for your baby and help with household activities for a few days after you leave the hospital. °· Keep all follow-up visits for you and your baby as told by your health care provider. This is important. °Contact a health care provider if: °· You have: °? Bad-smelling vaginal discharge. °? Difficulty urinating. °? Pain when urinating. °? A sudden increase or decrease in the frequency of your bowel movements. °? More redness, swelling, or pain around your incision. °? More fluid or blood coming from your incision. °? Pus or a bad smell coming from your incision. °? A fever. °? A rash. °? Little or no interest in activities you used to enjoy. °? Questions about caring for yourself or your baby. °? Nausea. °· Your incision feels warm to the touch. °· Your breasts turn red or become painful or hard. °· You feel unusually sad or worried. °· You vomit. °· You pass large blood clots from your vagina. If you pass a blood clot, save it to show to your health care provider. Do not flush blood clots down the toilet without showing your health care provider. °· You urinate more than usual. °· You are dizzy or light-headed. °· You have not breastfed and have not had a menstrual period for 12 weeks after delivery. °· You stopped breastfeeding and have not had a menstrual period for 12 weeks after stopping breastfeeding. °Get help right away if: °· You have: °? Pain that does not go away or get better with medicine. °? Chest pain. °? Difficulty breathing. °? Blurred vision or spots in your vision. °? Thoughts about hurting yourself or your baby. °? New pain in your abdomen or in one of your  legs. °? A severe headache. °· You faint. °· You bleed from your vagina so much that you fill two sanitary pads in one hour. °This information is not intended to replace advice given to you by your health care provider. Make sure you discuss any questions you have with your health care provider. °Document Released: 09/17/2001 Document Revised: 01/29/2016 Document Reviewed: 11/30/2014 °Elsevier Interactive Patient Education © 2018 [employer] ° °

## 2017-04-29 NOTE — Progress Notes (Signed)
Postop Note Day # 3  S:  Patient resting comfortable in bed.  Pain controlled.  Tolerating general diet. + flatus, no BM.  Lochia appropriate.  Ambulating without difficulty.  She denies n/v/f/c, SOB, or CP.  Pt plans on breastfeeding.  Voiding freely.  O: Temp:  [98 F (36.7 C)-98.3 F (36.8 C)] 98.3 F (36.8 C) (04/21 0630) Pulse Rate:  [88] 88 (04/21 0630) Resp:  [18] 18 (04/21 0630) BP: (140-144)/(81-85) 144/85 (04/21 0630) SpO2:  [100 %] 100 % (04/20 1729)   Gen: A&Ox3, NAD CV: RRR Resp: CTAB Abdomen: soft, NT, ND +BS Uterus: firm, non-tender, below umbilicus Incision: honeycomb dressing C/D/I Ext: No edema, no calf tenderness bilaterally  Labs:  CBC Latest Ref Rng & Units 04/27/2017 04/26/2017 04/25/2017  WBC 4.0 - 10.5 K/uL 11.7(H) 12.2(H) 7.1  Hemoglobin 12.0 - 15.0 g/dL 4.0(J8.6(L) 11.0(L) 11.2(L)  Hematocrit 36.0 - 46.0 % 26.2(L) 32.4(L) 33.1(L)  Platelets 150 - 400 K/uL 234 249 282    A/P: Pt is a 37 y.o. G1P1001 s/p primary C-section, POD#3  - Pain well controlled -GU: Voiding freely -GI: Tolerating general diet -Activity: encouraged sitting up to chair and ambulation as tolerated -Prophylaxis: early ambulation, SCDs while in bed -Labs: on iron daily -baby boy circ completed  DISPO: Meeting postpartum milestones appropriately, plan for discharge home today  Myna HidalgoJennifer Zebulan Hinshaw, DO [phone] (pager) [phone] (office)

## 2017-04-29 NOTE — Lactation Note (Signed)
This note was copied from a baby's chart. Lactation Consultation Note  Patient Name: Samantha Jacobs AVWUJ'WToday's Date: 04/29/2017 Reason for consult: Follow-up assessment;Primapara;1st time breastfeeding;Term;Nipple pain/trauma;Other (Comment);Infant weight loss(per mom pumping / bottle feeding for now due to sore nipples milk in see LC note ) Baby is 5064 hours old for D/C with mom today  LC reviewed doc flow sheets and baby has been receiving all bottles  MBURN informed LC mom is engorged and declined ice packs and plans to ice when she gets home  And then pump. Mom confirmed that and explained why she is just pumping now due to sore nipples, using  Coconut oil , comfort gels , and has shells ( has not  Used them )  LC reviewed sore nipple and engorgement prevention and tx.  Per mom will have a DEBP Spectra at home.  LC recommended to mom once her sore nipples clear, and desires to re-latch her baby to work on it at home and  [facility]Vladimir Crofts. LC informed mom she will need her Pedis doctor to [facility].  Mother informed of post-discharge support and given phone number to the lactation department, including services for phone call assistance; out-patient appointments; and breastfeeding support group. List of other breastfeeding resources in the community given in the handout. Encouraged mother to call for problems or concerns related to breastfeeding.  Maternal Data    Feeding Feeding Type: (mom has not fed since 0120/ see progress notes)  LATCH Score                   Interventions Interventions: Breast feeding basics reviewed  Lactation Tools Discussed/Used Pump Review: Milk Storage(reviewed / MAI )   Consult Status Consult Status: Complete Date: 04/29/17    Samantha SprangMargaret Ann Rie Jacobs 04/29/2017, 9:15 AM

## 2017-05-05 ENCOUNTER — Encounter (HOSPITAL_COMMUNITY): Payer: Self-pay | Admitting: *Deleted

## 2017-05-05 ENCOUNTER — Other Ambulatory Visit: Payer: Self-pay

## 2017-05-05 ENCOUNTER — Inpatient Hospital Stay (HOSPITAL_COMMUNITY)
Admission: AD | Admit: 2017-05-05 | Discharge: 2017-05-08 | DRG: 776 | Disposition: A | Discharge status: Home / Self Care | Payer: BLUE CROSS/BLUE SHIELD | Source: Ambulatory Visit | Attending: Obstetrics and Gynecology | Admitting: Obstetrics and Gynecology

## 2017-05-05 ENCOUNTER — Inpatient Hospital Stay (HOSPITAL_COMMUNITY): Payer: BLUE CROSS/BLUE SHIELD

## 2017-05-05 DIAGNOSIS — Z87891 Personal history of nicotine dependence: Secondary | ICD-10-CM

## 2017-05-05 DIAGNOSIS — O1415 Severe pre-eclampsia, complicating the puerperium: Principal | ICD-10-CM | POA: Diagnosis present

## 2017-05-05 DIAGNOSIS — I5030 Unspecified diastolic (congestive) heart failure: Secondary | ICD-10-CM | POA: Diagnosis present

## 2017-05-05 DIAGNOSIS — R079 Chest pain, unspecified: Secondary | ICD-10-CM | POA: Diagnosis present

## 2017-05-05 DIAGNOSIS — O1495 Unspecified pre-eclampsia, complicating the puerperium: Secondary | ICD-10-CM | POA: Diagnosis present

## 2017-05-05 DIAGNOSIS — R0600 Dyspnea, unspecified: Secondary | ICD-10-CM | POA: Diagnosis not present

## 2017-05-05 DIAGNOSIS — O9089 Other complications of the puerperium, not elsewhere classified: Secondary | ICD-10-CM | POA: Diagnosis present

## 2017-05-05 DIAGNOSIS — R0602 Shortness of breath: Secondary | ICD-10-CM

## 2017-05-05 LAB — CBC
HEMATOCRIT: 28.3 % — AB (ref 36.0–46.0)
HEMOGLOBIN: 9.4 g/dL — AB (ref 12.0–15.0)
MCH: 26.1 pg (ref 26.0–34.0)
MCHC: 33.2 g/dL (ref 30.0–36.0)
MCV: 78.6 fL (ref 78.0–100.0)
Platelets: 347 10*3/uL (ref 150–400)
RBC: 3.6 MIL/uL — AB (ref 3.87–5.11)
RDW: 13.6 % (ref 11.5–15.5)
WBC: 7.6 10*3/uL (ref 4.0–10.5)

## 2017-05-05 LAB — COMPREHENSIVE METABOLIC PANEL
ALT: 320 U/L — ABNORMAL HIGH (ref 14–54)
ANION GAP: 10 (ref 5–15)
AST: 108 U/L — ABNORMAL HIGH (ref 15–41)
Albumin: 3.1 g/dL — ABNORMAL LOW (ref 3.5–5.0)
Alkaline Phosphatase: 126 U/L (ref 38–126)
BILIRUBIN TOTAL: 0.4 mg/dL (ref 0.3–1.2)
BUN: 18 mg/dL (ref 6–20)
CO2: 21 mmol/L — ABNORMAL LOW (ref 22–32)
Calcium: 9.3 mg/dL (ref 8.9–10.3)
Chloride: 109 mmol/L (ref 101–111)
Creatinine, Ser: 1.03 mg/dL — ABNORMAL HIGH (ref 0.44–1.00)
Glucose, Bld: 82 mg/dL (ref 65–99)
POTASSIUM: 4.2 mmol/L (ref 3.5–5.1)
Sodium: 140 mmol/L (ref 135–145)
TOTAL PROTEIN: 6.5 g/dL (ref 6.5–8.1)

## 2017-05-05 LAB — URINALYSIS, ROUTINE W REFLEX MICROSCOPIC
Bilirubin Urine: NEGATIVE
GLUCOSE, UA: NEGATIVE mg/dL
Hgb urine dipstick: NEGATIVE
KETONES UR: NEGATIVE mg/dL
LEUKOCYTES UA: NEGATIVE
NITRITE: NEGATIVE
PH: 6 (ref 5.0–8.0)
Protein, ur: NEGATIVE mg/dL
SPECIFIC GRAVITY, URINE: 1.017 (ref 1.005–1.030)

## 2017-05-05 LAB — PROTEIN / CREATININE RATIO, URINE
Creatinine, Urine: 122 mg/dL
PROTEIN CREATININE RATIO: 0.07 mg/mg{creat} (ref 0.00–0.15)
TOTAL PROTEIN, URINE: 8 mg/dL

## 2017-05-05 MED ORDER — MAGNESIUM SULFATE 40 G IN LACTATED RINGERS - SIMPLE
2.0000 g/h | INTRAVENOUS | Status: AC
  Administered 2017-05-05 – 2017-05-06 (×2): 2 g/h via INTRAVENOUS
  Filled 2017-05-05 (×2): qty 40

## 2017-05-05 MED ORDER — LABETALOL HCL 5 MG/ML IV SOLN: 20.0000 mg | INTRAVENOUS | Status: DC | PRN

## 2017-05-05 MED ORDER — LACTATED RINGERS IV SOLN
INTRAVENOUS | Status: DC
  Administered 2017-05-05 – 2017-05-06 (×2): via INTRAVENOUS

## 2017-05-05 MED ORDER — LACTATED RINGERS IV SOLN
INTRAVENOUS | Status: DC
  Administered 2017-05-05: 21:00:00 via INTRAVENOUS

## 2017-05-05 MED ORDER — HYDRALAZINE HCL 20 MG/ML IJ SOLN: 5.0000 mg | INTRAMUSCULAR | Status: DC | PRN

## 2017-05-05 MED ORDER — HYDRALAZINE HCL 20 MG/ML IJ SOLN
10.0000 mg | Freq: Once | INTRAMUSCULAR | Status: AC | PRN
  Administered 2017-05-05: 10 mg via INTRAVENOUS
  Filled 2017-05-05: qty 1

## 2017-05-05 MED ORDER — MAGNESIUM SULFATE BOLUS VIA INFUSION
4.0000 g | Freq: Once | INTRAVENOUS | Status: AC
  Administered 2017-05-05: 4 g via INTRAVENOUS
  Filled 2017-05-05: qty 500

## 2017-05-05 NOTE — MAU Note (Signed)
Pt had a cesarean on 4/18.  For past two days, feeling SOB, some chest pressure, and coughing.  Denies pain, but having pressure when inhaling.

## 2017-05-05 NOTE — MAU Provider Note (Addendum)
History     CSN: [id number]  Arrival date and time: 05/05/17 1814   First Provider Initiated Contact with Patient 05/05/17 1855      Chief Complaint  Patient presents with  . Chest Pain  . Shortness of Breath  . Cough   37 y.o. female 9 days post CS here with chest pressure and SOB. Sx started yesterday and worsened today. Denies HA, visual, disturbances, and epigastric pain. She had some mild HTN on admission for her IOL.   OB History    Gravida  1   Para  1   Term  1   Preterm      AB      Living  1     SAB      TAB      Ectopic      Multiple  0   Live Births  1           Past Medical History:  Diagnosis Date  . Medical history non-contributory     Past Surgical History:  Procedure Laterality Date  . CESAREAN SECTION N/A 04/26/2017   Procedure: CESAREAN SECTION;  Surgeon: Myna Hidalgo, DO;  Location: [facility];  Service: Obstetrics;  Laterality: N/A;  . CHOLECYSTECTOMY  09/21/2011   Procedure: LAPAROSCOPIC CHOLECYSTECTOMY WITH INTRAOPERATIVE CHOLANGIOGRAM;  Surgeon: Emelia Loron, MD;  Location: [facility];  Service: General;  Laterality: N/A;  laparoscopic cholescystectomy with cholangiogram    History reviewed. No pertinent family history.  Social History   Tobacco Use  . Smoking status: Former Smoker    Last attempt to quit: 01/09/2001    Years since quitting: 16.3  . Smokeless tobacco: Never Used  Substance Use Topics  . Alcohol use: Yes    Comment: OCCASIONAL  . Drug use: No    Allergies: No Known Allergies  Medications Prior to Admission  Medication Sig Dispense Refill Last Dose  . docusate sodium (COLACE) 100 MG capsule Take 1 capsule (100 mg total) by mouth 2 (two) times daily. 30 capsule 0   . Fe Fum-FePoly-Vit C-Vit B3 (INTEGRA) 62.5-62.5-40-3 MG CAPS TK 1 C PO QD  1 04/25/2017 at Unknown time  . ibuprofen (ADVIL,MOTRIN) 600 MG tablet Take 1 tablet (600 mg total) by mouth every 6 (six) hours. 30 tablet 0   . oxyCODONE  (OXY IR/ROXICODONE) 5 MG immediate release tablet Take 1 tablet (5 mg total) by mouth every 6 (six) hours as needed for moderate pain or severe pain (pain scale 4-7). 25 tablet 0   . Prenatal Vit-Fe Fumarate-FA (PRENATAL MULTIVITAMIN) TABS tablet Take 1 tablet by mouth daily at 12 noon.   04/24/2017 at Unknown time    Review of Systems  Respiratory: Positive for cough and shortness of breath. Negative for chest tightness.   Cardiovascular: Positive for chest pain and leg swelling.  Gastrointestinal: Negative for abdominal pain.   Physical Exam   Blood pressure 139/66, pulse (!) 48, temperature 98.7 F (37.1 C), temperature source Oral, resp. rate 18, weight 292 lb (132.5 kg), SpO2 97 %, unknown if currently breastfeeding. Patient Vitals for the past 24 hrs:  BP Temp Temp src Pulse Resp SpO2 Weight  05/05/17 2002 139/66 - - (!) 48 - 97 % -  05/05/17 1947 (!) 155/68 - - (!) 53 - 96 % -  05/05/17 1932 (!) 159/83 - - (!) 49 - 96 % -  05/05/17 1917 (!) 167/81 - - (!) 44 - 95 % -  05/05/17 1900 (!) 163/82 - - Marland Kitchen)  48 - 96 % -  05/05/17 1853 (!) 169/85 - - (!) 47 - - -  05/05/17 1846 (!) 170/88 - - (!) 44 - - -  05/05/17 1843 (!) 178/91 - - (!) 44 - - -  05/05/17 1824 (!) 159/83 98.7 F (37.1 C) Oral (!) 45 18 - 292 lb (132.5 kg)  05/05/17 1823 - - - - - 97 % -    Physical Exam  Constitutional: She is oriented to person, place, and time. She appears well-developed and well-nourished. No distress.  HENT:  Head: Normocephalic and atraumatic.  Cardiovascular: Normal rate, regular rhythm and normal heart sounds.  Respiratory: Effort normal and breath sounds normal. No respiratory distress. She has no wheezes. She has no rales.  Musculoskeletal: Normal range of motion. She exhibits edema (1+ BLE).  Neurological: She is alert and oriented to person, place, and time.  Skin: Skin is warm and dry.  Psychiatric: She has a normal mood and affect.   Results for orders placed or performed during the  hospital encounter of 05/05/17 (from the past 24 hour(s))  Urinalysis, Routine w reflex microscopic     Status: Abnormal   Collection Time: 05/05/17  6:28 PM  Result Value Ref Range   Color, Urine YELLOW YELLOW   APPearance HAZY (A) CLEAR   Specific Gravity, Urine 1.017 1.005 - 1.030   pH 6.0 5.0 - 8.0   Glucose, UA NEGATIVE NEGATIVE mg/dL   Hgb urine dipstick NEGATIVE NEGATIVE   Bilirubin Urine NEGATIVE NEGATIVE   Ketones, ur NEGATIVE NEGATIVE mg/dL   Protein, ur NEGATIVE NEGATIVE mg/dL   Nitrite NEGATIVE NEGATIVE   Leukocytes, UA NEGATIVE NEGATIVE  Protein / creatinine ratio, urine     Status: None   Collection Time: 05/05/17  6:28 PM  Result Value Ref Range   Creatinine, Urine 122.00 mg/dL   Total Protein, Urine 8 mg/dL   Protein Creatinine Ratio 0.07 0.00 - 0.15 mg/mg[Cre]  Comprehensive metabolic panel     Status: Abnormal   Collection Time: 05/05/17  7:04 PM  Result Value Ref Range   Sodium 140 135 - 145 mmol/L   Potassium 4.2 3.5 - 5.1 mmol/L   Chloride 109 101 - 111 mmol/L   CO2 21 (L) 22 - 32 mmol/L   Glucose, Bld 82 65 - 99 mg/dL   BUN 18 6 - 20 mg/dL   Creatinine, Ser 1.61 (H) 0.44 - 1.00 mg/dL   Calcium 9.3 8.9 - 09.6 mg/dL   Total Protein 6.5 6.5 - 8.1 g/dL   Albumin 3.1 (L) 3.5 - 5.0 g/dL   AST 045 (H) 15 - 41 U/L   ALT 320 (H) 14 - 54 U/L   Alkaline Phosphatase 126 38 - 126 U/L   Total Bilirubin 0.4 0.3 - 1.2 mg/dL   GFR calc non Af Amer >60 >60 mL/min   GFR calc Af Amer >60 >60 mL/min   Anion gap 10 5 - 15  CBC     Status: Abnormal   Collection Time: 05/05/17  7:04 PM  Result Value Ref Range   WBC 7.6 4.0 - 10.5 K/uL   RBC 3.60 (L) 3.87 - 5.11 MIL/uL   Hemoglobin 9.4 (L) 12.0 - 15.0 g/dL   HCT 40.9 (L) 81.1 - 91.4 %   MCV 78.6 78.0 - 100.0 fL   MCH 26.1 26.0 - 34.0 pg   MCHC 33.2 30.0 - 36.0 g/dL   RDW 78.2 95.6 - 21.3 %   Platelets 347 150 - 400  K/uL   MAU Course  Procedures Hydralazine  MDM Labs and EKG ordered. Consult with Dr. Dimple Casey for  EKG > no concerns for bradycardia or acute pathology, would recommend rpt EKG for wandering baseline. Presentation, clinical findings, and plan discussed with Dr. Richardson Dopp. Plan for admit.  Assessment and Plan  Postpartum Preeclampsia  Admit to [facility] per Dr. Ron Parker, CNM 05/05/2017, 8:21 PM

## 2017-05-05 NOTE — H&P (Signed)
Chief Complaint  Patient presents with  . Chest Pain  . Shortness of Breath  . Cough   37 y.o. female 9 days post CS here with chest pressure and SOB. Sx started yesterday and worsened today. Denies HA, visual, disturbances, and epigastric pain. She had some mild HTN on admission for her IOL. Prenatal care provided by Dr. Geryl Rankins with [facility].            OB History    Gravida  1   Para  1   Term  1   Preterm      AB      Living  1     SAB      TAB      Ectopic      Multiple  0   Live Births  1               Past Medical History:  Diagnosis Date  . Medical history non-contributory          Past Surgical History:  Procedure Laterality Date  . CESAREAN SECTION N/A 04/26/2017   Procedure: CESAREAN SECTION;  Surgeon: Myna Hidalgo, DO;  Location: [facility];  Service: Obstetrics;  Laterality: N/A;  . CHOLECYSTECTOMY  09/21/2011   Procedure: LAPAROSCOPIC CHOLECYSTECTOMY WITH INTRAOPERATIVE CHOLANGIOGRAM;  Surgeon: Emelia Loron, MD;  Location: [facility];  Service: General;  Laterality: N/A;  laparoscopic cholescystectomy with cholangiogram    History reviewed. No pertinent family history.  Social History        Tobacco Use  . Smoking status: Former Smoker    Last attempt to quit: 01/09/2001    Years since quitting: 16.3  . Smokeless tobacco: Never Used  Substance Use Topics  . Alcohol use: Yes    Comment: OCCASIONAL  . Drug use: No    Allergies: No Known Allergies         Medications Prior to Admission  Medication Sig Dispense Refill Last Dose  . docusate sodium (COLACE) 100 MG capsule Take 1 capsule (100 mg total) by mouth 2 (two) times daily. 30 capsule 0   . Fe Fum-FePoly-Vit C-Vit B3 (INTEGRA) 62.5-62.5-40-3 MG CAPS TK 1 C PO QD  1 04/25/2017 at Unknown time  . ibuprofen (ADVIL,MOTRIN) 600 MG tablet Take 1 tablet (600 mg total) by mouth every 6 (six) hours. 30 tablet 0   . oxyCODONE (OXY  IR/ROXICODONE) 5 MG immediate release tablet Take 1 tablet (5 mg total) by mouth every 6 (six) hours as needed for moderate pain or severe pain (pain scale 4-7). 25 tablet 0   . Prenatal Vit-Fe Fumarate-FA (PRENATAL MULTIVITAMIN) TABS tablet Take 1 tablet by mouth daily at 12 noon.   04/24/2017 at Unknown time    Review of Systems  Respiratory: Positive for cough and shortness of breath. Negative for chest tightness.   Cardiovascular: Positive for chest pain and leg swelling.  Gastrointestinal: Negative for abdominal pain.   Physical Exam   Blood pressure 139/66, pulse (!) 48, temperature 98.7 F (37.1 C), temperature source Oral, resp. rate 18, weight 292 lb (132.5 kg), SpO2 97 %, unknown if currently breastfeeding. Patient Vitals for the past 24 hrs:  BP Temp Temp src Pulse Resp SpO2 Weight  05/05/17 2002 139/66 - - (!) 48 - 97 % -  05/05/17 1947 (!) 155/68 - - (!) 53 - 96 % -  05/05/17 1932 (!) 159/83 - - (!) 49 - 96 % -  05/05/17 1917 (!) 167/81 - - (!) 44 -  95 % -  05/05/17 1900 (!) 163/82 - - (!) 48 - 96 % -  05/05/17 1853 (!) 169/85 - - (!) 47 - - -  05/05/17 1846 (!) 170/88 - - (!) 44 - - -  05/05/17 1843 (!) 178/91 - - (!) 44 - - -  05/05/17 1824 (!) 159/83 98.7 F (37.1 C) Oral (!) 45 18 - 292 lb (132.5 kg)  05/05/17 1823 - - - - - 97 % -    Physical Exam  Constitutional: She is oriented to person, place, and time. She appears well-developed and well-nourished. No distress.  HENT:  Head: Normocephalic and atraumatic.  Cardiovascular: Normal rate, regular rhythm and normal heart sounds.  Respiratory: Effort normal and breath sounds normal. No respiratory distress. She has no wheezes. She has no rales.  Musculoskeletal: Normal range of motion. She exhibits 2+ edema bilaterally up to mid calf.  Neurological: She is alert and oriented to person, place, and time.  Skin: Skin is warm and dry.  Psychiatric: She has a normal mood and affect.    LabResultsLast24Hours       Results for orders placed or performed during the hospital encounter of 05/05/17 (from the past 24 hour(s))  Urinalysis, Routine w reflex microscopic     Status: Abnormal   Collection Time: 05/05/17  6:28 PM  Result Value Ref Range   Color, Urine YELLOW YELLOW   APPearance HAZY (A) CLEAR   Specific Gravity, Urine 1.017 1.005 - 1.030   pH 6.0 5.0 - 8.0   Glucose, UA NEGATIVE NEGATIVE mg/dL   Hgb urine dipstick NEGATIVE NEGATIVE   Bilirubin Urine NEGATIVE NEGATIVE   Ketones, ur NEGATIVE NEGATIVE mg/dL   Protein, ur NEGATIVE NEGATIVE mg/dL   Nitrite NEGATIVE NEGATIVE   Leukocytes, UA NEGATIVE NEGATIVE  Protein / creatinine ratio, urine     Status: None   Collection Time: 05/05/17  6:28 PM  Result Value Ref Range   Creatinine, Urine 122.00 mg/dL   Total Protein, Urine 8 mg/dL   Protein Creatinine Ratio 0.07 0.00 - 0.15 mg/mg[Cre]  Comprehensive metabolic panel     Status: Abnormal   Collection Time: 05/05/17  7:04 PM  Result Value Ref Range   Sodium 140 135 - 145 mmol/L   Potassium 4.2 3.5 - 5.1 mmol/L   Chloride 109 101 - 111 mmol/L   CO2 21 (L) 22 - 32 mmol/L   Glucose, Bld 82 65 - 99 mg/dL   BUN 18 6 - 20 mg/dL   Creatinine, Ser 1.61 (H) 0.44 - 1.00 mg/dL   Calcium 9.3 8.9 - 09.6 mg/dL   Total Protein 6.5 6.5 - 8.1 g/dL   Albumin 3.1 (L) 3.5 - 5.0 g/dL   AST 045 (H) 15 - 41 U/L   ALT 320 (H) 14 - 54 U/L   Alkaline Phosphatase 126 38 - 126 U/L   Total Bilirubin 0.4 0.3 - 1.2 mg/dL   GFR calc non Af Amer >60 >60 mL/min   GFR calc Af Amer >60 >60 mL/min   Anion gap 10 5 - 15  CBC     Status: Abnormal   Collection Time: 05/05/17  7:04 PM  Result Value Ref Range   WBC 7.6 4.0 - 10.5 K/uL   RBC 3.60 (L) 3.87 - 5.11 MIL/uL   Hemoglobin 9.4 (L) 12.0 - 15.0 g/dL   HCT 40.9 (L) 81.1 - 91.4 %   MCV 78.6 78.0 - 100.0 fL   MCH 26.1 26.0 - 34.0 pg  MCHC 33.2 30.0 - 36.0 g/dL   RDW 29.5 62.1 - 30.8 %    Platelets 347 150 - 400 K/uL     MAU Course  Procedures Hydralazine  MDM Labs and EKG ordered. MAU provider Consult with Dr. Dimple Casey for EKG > no concerns for bradycardia or acute pathology, would recommend rpt EKG for wandering baseline. Presentation, clinical findings, and plan discussed with Dr. Richardson Dopp. Plan for admit.  Assessment and Plan  Postpartum Preeclampsia with severe features based on severe range bp and  elevated liver enzymes.  Hydralazine IV for severe range pressures. Plan to avoid labetalol given bradycardia.  Magnesium sulfate for seizure prophylaxis x 24 hours. Marland Kitchen  PIH labs in AM Strict I&O's Bradycardia improving. - NOrmal sinus rhythm on repeat EKG.. Pt denies sob currently. Will monitor closely.. Consider Echocardiogram if bradycardia does not continue to improve.  Pt seen and examined.. Plan of care discussed. All patients questions addressed.

## 2017-05-06 LAB — CBC
HEMATOCRIT: 29.4 % — AB (ref 36.0–46.0)
HEMOGLOBIN: 9.7 g/dL — AB (ref 12.0–15.0)
MCH: 26.1 pg (ref 26.0–34.0)
MCHC: 33 g/dL (ref 30.0–36.0)
MCV: 79 fL (ref 78.0–100.0)
Platelets: 351 10*3/uL (ref 150–400)
RBC: 3.72 MIL/uL — AB (ref 3.87–5.11)
RDW: 13.7 % (ref 11.5–15.5)
WBC: 6.9 10*3/uL (ref 4.0–10.5)

## 2017-05-06 LAB — COMPREHENSIVE METABOLIC PANEL
ALBUMIN: 3 g/dL — AB (ref 3.5–5.0)
ALT: 254 U/L — ABNORMAL HIGH (ref 14–54)
ANION GAP: 12 (ref 5–15)
AST: 67 U/L — ABNORMAL HIGH (ref 15–41)
Alkaline Phosphatase: 117 U/L (ref 38–126)
BILIRUBIN TOTAL: 0.3 mg/dL (ref 0.3–1.2)
BUN: 17 mg/dL (ref 6–20)
CALCIUM: 8 mg/dL — AB (ref 8.9–10.3)
CO2: 20 mmol/L — AB (ref 22–32)
Chloride: 108 mmol/L (ref 101–111)
Creatinine, Ser: 0.92 mg/dL (ref 0.44–1.00)
GFR calc non Af Amer: >60 mL/min (ref >60)
Glucose, Bld: 98 mg/dL (ref 65–99)
POTASSIUM: 3.9 mmol/L (ref 3.5–5.1)
SODIUM: 140 mmol/L (ref 135–145)
TOTAL PROTEIN: 6.2 g/dL — AB (ref 6.5–8.1)

## 2017-05-06 MED ORDER — NIFEDIPINE ER OSMOTIC RELEASE 30 MG PO TB24
30.0000 mg | ORAL_TABLET | Freq: Every day | ORAL | Status: DC
  Administered 2017-05-06 – 2017-05-08 (×3): 30 mg via ORAL
  Filled 2017-05-06 (×3): qty 1

## 2017-05-06 NOTE — Progress Notes (Signed)
Patient ID: Samantha Jacobs, female   DOB: 1980-11-04, 37 y.o.   MRN: [id number]  HD #2 readmitted for postpartum preeclampsia with severe features. POD #10 s/p Cesarean section  Subjective: SOB resolved. Denies headache visual disturbances or rUQ pain.   Vitals:   05/06/17 0458 05/06/17 0500 05/06/17 0811 05/06/17 0907  BP: 137/72  135/61 (!) 120/58  Pulse: (!) 58  (!) 56 63  Resp: _0 Temp:   98.2 F (36.8 C) 98.3 F (36.8 C)  TempSrc:   Oral   SpO2: 98%  97% 98%  Weight:  129.7 kg (286 lb)      UOP 200 cc q hour   General Alert and oriented No distress  CV RRR Lungs Clear no RRW Abdomen soft nontender  Extremities 2+ edema to mid calf. Normal reflexes.  No evidence of DVT  Results for orders placed or performed during the hospital encounter of 05/05/17 (from the past 48 hour(s))  Urinalysis, Routine w reflex microscopic     Status: Abnormal   Collection Time: 05/05/17  6:28 PM  Result Value Ref Range   Color, Urine YELLOW YELLOW   APPearance HAZY (A) CLEAR   Specific Gravity, Urine 1.017 1.005 - 1.030   pH 6.0 5.0 - 8.0   Glucose, UA NEGATIVE NEGATIVE mg/dL   Hgb urine dipstick NEGATIVE NEGATIVE   Bilirubin Urine NEGATIVE NEGATIVE   Ketones, ur NEGATIVE NEGATIVE mg/dL   Protein, ur NEGATIVE NEGATIVE mg/dL   Nitrite NEGATIVE NEGATIVE   Leukocytes, UA NEGATIVE NEGATIVE    Comment: Performed at [facility], [address] [place], [place] [address]  Protein / creatinine ratio, urine     Status: None   Collection Time: 05/05/17  6:28 PM  Result Value Ref Range   Creatinine, Urine 122.00 mg/dL   Total Protein, Urine 8 mg/dL    Comment: NO NORMAL RANGE ESTABLISHED FOR THIS TEST   Protein Creatinine Ratio 0.07 0.00 - 0.15 mg/mg[Cre]    Comment: Performed at [facility], [address], [place], [place] [address]  Comprehensive metabolic panel     Status: Abnormal   Collection Time: 05/05/17  7:04 PM  Result Value Ref Range   Sodium 140 135 - 145 mmol/L    Potassium 4.2 3.5 - 5.1 mmol/L   Chloride 109 101 - 111 mmol/L   CO2 21 (L) 22 - 32 mmol/L   Glucose, Bld 82 65 - 99 mg/dL   BUN 18 6 - 20 mg/dL   Creatinine, Ser 1.03 (H) 0.44 - 1.00 mg/dL   Calcium 9.3 8.9 - 10.3 mg/dL   Total Protein 6.5 6.5 - 8.1 g/dL   Albumin 3.1 (L) 3.5 - 5.0 g/dL   AST 108 (H) 15 - 41 U/L   ALT 320 (H) 14 - 54 U/L   Alkaline Phosphatase 126 38 - 126 U/L   Total Bilirubin 0.4 0.3 - 1.2 mg/dL   GFR calc non Af Amer >60 >60 mL/min   GFR calc Af Amer >60 >60 mL/min    Comment: (NOTE) The eGFR has been calculated using the CKD EPI equation. This calculation has not been validated in all clinical situations. eGFR's persistently <60 mL/min signify possible Chronic Kidney Disease.    Anion gap 10 5 - 15    Comment: Performed at [facility], [address], [place], [place] [address]  CBC     Status: Abnormal   Collection Time: 05/05/17  7:04 PM  Result Value Ref Range   WBC  7.6 4.0 - 10.5 K/uL   RBC 3.60 (L) 3.87 - 5.11 MIL/uL   Hemoglobin 9.4 (L) 12.0 - 15.0 g/dL   HCT 28.3 (L) 36.0 - 46.0 %   MCV 78.6 78.0 - 100.0 fL   MCH 26.1 26.0 - 34.0 pg   MCHC 33.2 30.0 - 36.0 g/dL   RDW 13.6 11.5 - 15.5 %   Platelets 347 150 - 400 K/uL    Comment: Performed at [facility], [address] [place], [place] [address]  Comprehensive metabolic panel     Status: Abnormal   Collection Time: 05/06/17  7:08 AM  Result Value Ref Range   Sodium 140 135 - 145 mmol/L   Potassium 3.9 3.5 - 5.1 mmol/L   Chloride 108 101 - 111 mmol/L   CO2 20 (L) 22 - 32 mmol/L   Glucose, Bld 98 65 - 99 mg/dL   BUN 17 6 - 20 mg/dL   Creatinine, Ser 0.92 0.44 - 1.00 mg/dL   Calcium 8.0 (L) 8.9 - 10.3 mg/dL   Total Protein 6.2 (L) 6.5 - 8.1 g/dL   Albumin 3.0 (L) 3.5 - 5.0 g/dL   AST 67 (H) 15 - 41 U/L   ALT 254 (H) 14 - 54 U/L   Alkaline Phosphatase 117 38 - 126 U/L   Total Bilirubin 0.3 0.3 - 1.2 mg/dL   GFR calc non Af Amer >60 >60 mL/min   GFR calc Af Amer >60 >60 mL/min     Comment: (NOTE) The eGFR has been calculated using the CKD EPI equation. This calculation has not been validated in all clinical situations. eGFR's persistently <60 mL/min signify possible Chronic Kidney Disease.    Anion gap 12 5 - 15    Comment: Performed at [facility], [address], [place], [place] [address]  CBC     Status: Abnormal   Collection Time: 05/06/17  7:08 AM  Result Value Ref Range   WBC 6.9 4.0 - 10.5 K/uL   RBC 3.72 (L) 3.87 - 5.11 MIL/uL   Hemoglobin 9.7 (L) 12.0 - 15.0 g/dL   HCT 29.4 (L) 36.0 - 46.0 %   MCV 79.0 78.0 - 100.0 fL   MCH 26.1 26.0 - 34.0 pg   MCHC 33.0 30.0 - 36.0 g/dL   RDW 13.7 11.5 - 15.5 %   Platelets 351 150 - 400 K/uL    Comment: Performed at [facility], [address], [place], [place] [address]    A/P Postpartum preeclampsia- pt stable . Continue magnesium sulfate until 8 pm. Will monitor bp overnight. Possible D/C home in AM Dr. Simona Huh to assume care at 7am 05/07/2017

## 2017-05-06 NOTE — Lactation Note (Signed)
Lactation Consultation Note  Patient Name: Samantha Jacobs Today's Date: 05/06/2017  Patient was readmitted at 10 days post partum for treatment of preeclampsia. She has been pumping and bottle feeding due to sore nipples but only pumping once or twice per day.  Supplementing with formula.  Mom is currently pumping one breast at a time and feels more comfortable single pumping.  She has a Spectra pump at home.  Mom may try to latch baby later today.  Discussed importance of pumping every 3 hours to establish and maintain her milk supply.  Encouraged to call for concerns.                              Interventions    Lactation Tools Discussed/Used     Consult Status      Huston Foley 05/06/2017, 11:33 AM

## 2017-05-07 ENCOUNTER — Inpatient Hospital Stay (HOSPITAL_COMMUNITY): Payer: BLUE CROSS/BLUE SHIELD

## 2017-05-07 DIAGNOSIS — R0600 Dyspnea, unspecified: Secondary | ICD-10-CM

## 2017-05-07 LAB — COMPREHENSIVE METABOLIC PANEL
ALT: 147 U/L — ABNORMAL HIGH (ref 14–54)
ANION GAP: 10 (ref 5–15)
AST: 28 U/L (ref 15–41)
Albumin: 3.1 g/dL — ABNORMAL LOW (ref 3.5–5.0)
Alkaline Phosphatase: 114 U/L (ref 38–126)
BILIRUBIN TOTAL: 0.4 mg/dL (ref 0.3–1.2)
BUN: 12 mg/dL (ref 6–20)
CO2: 22 mmol/L (ref 22–32)
Calcium: 8.8 mg/dL — ABNORMAL LOW (ref 8.9–10.3)
Chloride: 110 mmol/L (ref 101–111)
Creatinine, Ser: 0.97 mg/dL (ref 0.44–1.00)
Glucose, Bld: 81 mg/dL (ref 65–99)
Potassium: 4.2 mmol/L (ref 3.5–5.1)
Sodium: 142 mmol/L (ref 135–145)
TOTAL PROTEIN: 7.3 g/dL (ref 6.5–8.1)

## 2017-05-07 LAB — ECHOCARDIOGRAM COMPLETE: WEIGHTICAEL: 4492 [oz_av]

## 2017-05-07 MED ORDER — FUROSEMIDE 10 MG/ML IJ SOLN
40.0000 mg | Freq: Once | INTRAMUSCULAR | Status: AC
  Administered 2017-05-07: 40 mg via INTRAVENOUS
  Filled 2017-05-07: qty 4

## 2017-05-07 MED ORDER — SODIUM CHLORIDE 0.9 % IV SOLN
INTRAVENOUS | Status: DC
  Administered 2017-05-07: 16:00:00 via INTRAVENOUS

## 2017-05-07 MED ORDER — FUROSEMIDE 10 MG/ML IJ SOLN
40.0000 mg | Freq: Once | INTRAMUSCULAR | Status: AC
  Administered 2017-05-08: 40 mg via INTRAVENOUS
  Filled 2017-05-07: qty 4

## 2017-05-07 NOTE — Progress Notes (Signed)
  Echocardiogram 2D Echocardiogram has been performed.  Leta Jungling M 05/07/2017, 3:46 PM

## 2017-05-07 NOTE — Progress Notes (Signed)
Patient ID: Samantha Jacobs, female   DOB: 1980-01-14, 37 y.o.   MRN: [id number]  HD #3 readmitted for postpartum preeclampsia with severe features. POD #11 s/p Cesarean section  Subjective: SOB resolved. Pt still has a little bit of a cough but she feels it has decreased also.  She can lay flat without sitting up gasping for air.  Denies headache visual disturbances or rUQ pain.   Vitals:   05/06/17 2351 05/07/17 0416 05/07/17 0519 05/07/17 0807  BP: (!) 146/77 137/72  (!) 141/79  Pulse: 64 68  66  Resp: Temp: 98.4 F (36.9 C) 98.4 F (36.9 C)  98.4 F (36.9 C)  TempSrc: Oral Oral  Oral  SpO2: 97% 99%  98%  Weight:   127.3 kg (280 lb 12 oz)        Intake/Output Summary (Last 24 hours) at 05/07/2017 0849 Last data filed at 05/07/2017 0437 Gross per 24 hour  Intake 1350 ml  Output 4450 ml  Net -3100 ml   General Alert and oriented No distress  CV RRR Lungs Clear no RRW Abdomen soft nontender  Extremities:  SCDS in place.  CBC Latest Ref Rng & Units 05/06/2017 05/05/2017 04/27/2017  WBC 4.0 - 10.5 K/uL 6.9 7.6 11.7(H)  Hemoglobin 12.0 - 15.0 g/dL 8.2(N) 5.6(O) 1.3(Y)  Hematocrit 36.0 - 46.0 % 29.4(L) 28.3(L) 26.2(L)  Platelets 150 - 400 K/uL 351 347 234    CMP Latest Ref Rng & Units 05/06/2017 05/05/2017 04/25/2017  Glucose 65 - 99 mg/dL 98 82 84  BUN 6 - 20 mg/dL Creatinine 0.44 - 1.00 mg/dL 8.65 7.84(O) 9.62  Sodium 135 - 145 mmol/L 140 140 136  Potassium 3.5 - 5.1 mmol/L 3.9 4.2 4.4  Chloride 101 - 111 mmol/L 108 109 107  CO2 22 - 32 mmol/L 20(L) 21(L) 18(L)  Calcium 8.9 - 10.3 mg/dL 8.0(L) 9.3 9.4  Total Protein 6.5 - 8.1 g/dL 6.2(L) 6.5 7.1  Total Bilirubin 0.3 - 1.2 mg/dL 0.3 0.4 9.5(M)  Alkaline Phos 38 - 126 U/L 117 126 180(H)  AST 15 - 41 U/L 67(H) 108(H) 24  ALT 14 - 54 U/L 254(H) 320(H) 22    A/P Postpartum preeclampsia- pt improving.  Labs trending down. S/p Magnesium x 24 hours.  BP controlled on Procardia Bradycardia has resolved.   Consult Cardiology to rule out Postpartum Cardiomyopathy and cardiac echo. Due to presentation and persistent cough.

## 2017-05-07 NOTE — Lactation Note (Signed)
Lactation Consultation Note  Patient Name: Lillyn Wieczorek Today's Date: 05/07/2017  Mom is pumping inconsistently due to being tired.  Obtaining 30 mls.  Baby in room with mom.  Offered assist with latch but she states baby just ate.  Recommended pumping every 2-3 hours to increase milk supply.  Encouraged to call for assist prn.                           Interventions    Lactation Tools Discussed/Used     Consult Status      Huston Foley 05/07/2017, 11:48 AM

## 2017-05-08 LAB — COMPREHENSIVE METABOLIC PANEL
ALK PHOS: 114 U/L (ref 38–126)
ALT: 120 U/L — AB (ref 14–54)
AST: 29 U/L (ref 15–41)
Albumin: 3.3 g/dL — ABNORMAL LOW (ref 3.5–5.0)
Anion gap: 10 (ref 5–15)
BUN: 17 mg/dL (ref 6–20)
CALCIUM: 9 mg/dL (ref 8.9–10.3)
CO2: 22 mmol/L (ref 22–32)
CREATININE: 1.06 mg/dL — AB (ref 0.44–1.00)
Chloride: 105 mmol/L (ref 101–111)
GFR calc non Af Amer: >60 mL/min (ref >60)
Glucose, Bld: 97 mg/dL (ref 65–99)
Potassium: 4.4 mmol/L (ref 3.5–5.1)
Sodium: 137 mmol/L (ref 135–145)
Total Bilirubin: 0.4 mg/dL (ref 0.3–1.2)
Total Protein: 6.9 g/dL (ref 6.5–8.1)

## 2017-05-08 MED ORDER — NIFEDIPINE ER 30 MG PO TB24: 30.0000 mg | ORAL_TABLET | Freq: Every day | ORAL | 2 refills | Status: AC

## 2017-05-08 NOTE — Progress Notes (Signed)
pts dressing removed   And  Teaching complete

## 2017-05-08 NOTE — Discharge Instructions (Signed)
Heart Failure °Heart failure means your heart has trouble pumping blood. This makes it hard for your body to work well. Heart failure is usually a long-term (chronic) condition. You must take good care of yourself and follow your doctor's treatment plan. °Follow these instructions at home: °· Take your heart medicine as told by your doctor. °? Do not stop taking medicine unless your doctor tells you to. °? Do not skip any dose of medicine. °? Refill your medicines before they run out. °? Take other medicines only as told by your doctor or pharmacist. °· Stay active if told by your doctor. The elderly and people with severe heart failure should talk with a doctor about physical activity. °· Eat heart-healthy foods. Choose foods that are without trans fat and are low in saturated fat, cholesterol, and salt (sodium). This includes fresh or frozen fruits and vegetables, fish, lean meats, fat-free or low-fat dairy foods, whole grains, and high-fiber foods. Lentils and dried peas and beans (legumes) are also good choices. °· Limit salt if told by your doctor. °· Cook in a healthy way. Roast, grill, broil, bake, poach, steam, or stir-fry foods. °· Limit fluids as told by your doctor. °· Weigh yourself every morning. Do this after you pee (urinate) and before you eat breakfast. Write down your weight to give to your doctor. °· Take your blood pressure and write it down if your doctor tells you to. °· Ask your doctor how to check your pulse. Check your pulse as told. °· Lose weight if told by your doctor. °· Stop smoking or chewing tobacco. Do not use gum or patches that help you quit without your doctor's approval. °· Schedule and go to doctor visits as told. °· Nonpregnant women should have no more than 1 drink a day. Men should have no more than 2 drinks a day. Talk to your doctor about drinking alcohol. °· Stop illegal drug use. °· Stay current with shots (immunizations). °· Manage your health conditions as told by your  doctor. °· Learn to manage your stress. °· Rest when you are tired. °· If it is really hot outside: °? Avoid intense activities. °? Use air conditioning or fans, or get in a cooler place. °? Avoid caffeine and alcohol. °? Wear loose-fitting, lightweight, and light-colored clothing. °· If it is really cold outside: °? Avoid intense activities. °? Layer your clothing. °? Wear mittens or gloves, a hat, and a scarf when going outside. °? Avoid alcohol. °· Learn about heart failure and get support as needed. °· Get help to maintain or improve your quality of life and your ability to care for yourself as needed. °Contact a doctor if: °· You gain weight quickly. °· You are more short of breath than usual. °· You cannot do your normal activities. °· You tire easily. °· You cough more than normal, especially with activity. °· You have any or more puffiness (swelling) in areas such as your hands, feet, ankles, or belly (abdomen). °· You cannot sleep because it is hard to breathe. °· You feel like your heart is beating fast (palpitations). °· You get dizzy or light-headed when you stand up. °Get help right away if: °· You have trouble breathing. °· There is a change in mental status, such as becoming less alert or not being able to focus. °· You have chest pain or discomfort. °· You faint. °This information is not intended to replace advice given to you by your health care provider. Make sure you   discuss any questions you have with your health care provider. Document Released: 10/05/2007 Document Revised: 06/03/2015 Document Reviewed: 02/12/2012 Elsevier Interactive Patient Education  2017 [employer]     Preeclampsia and Eclampsia Preeclampsia is a serious condition that develops only during pregnancy. It is also called toxemia of pregnancy. This condition causes high blood pressure along with other symptoms, such as swelling and headaches. These symptoms may develop as the condition gets worse. Preeclampsia may  occur at 20 weeks of pregnancy or later. Diagnosing and treating preeclampsia early is very important. If not treated early, it can cause serious problems for you and your baby. One problem it can lead to is eclampsia, which is a condition that causes muscle jerking or shaking (convulsions or seizures) in the mother. Delivering your baby is the best treatment for preeclampsia or eclampsia. Preeclampsia and eclampsia symptoms usually go away after your baby is born. What are the causes? The cause of preeclampsia is not known. What increases the risk? The following risk factors make you more likely to develop preeclampsia:  Being pregnant for the first time.  Having had preeclampsia during a past pregnancy.  Having a family history of preeclampsia.  Having high blood pressure.  Being pregnant with twins or triplets.  Being 70 or older.  Being African-American.  Having kidney disease or diabetes.  Having medical conditions such as lupus or blood diseases.  Being very overweight (obese).  What are the signs or symptoms? The earliest signs of preeclampsia are:  High blood pressure.  Increased protein in your urine. Your health care provider will check for this at every visit before you give birth (prenatal visit).  Other symptoms that may develop as the condition gets worse include:  Severe headaches.  Sudden weight gain.  Swelling of the hands, face, legs, and feet.  Nausea and vomiting.  Vision problems, such as blurred or double vision.  Numbness in the face, arms, legs, and feet.  Urinating less than usual.  Dizziness.  Slurred speech.  Abdominal pain, especially upper abdominal pain.  Convulsions or seizures.  Symptoms generally go away after giving birth. How is this diagnosed? There are no screening tests for preeclampsia. Your health care provider will ask you about symptoms and check for signs of preeclampsia during your prenatal visits. You may also  have tests that include:  Urine tests.  Blood tests.  Checking your blood pressure.  Monitoring your babys heart rate.  Ultrasound.  How is this treated? You and your health care provider will determine the treatment approach that is best for you. Treatment may include:  Having more frequent prenatal exams to check for signs of preeclampsia, if you have an increased risk for preeclampsia.  Bed rest.  Reducing how much salt (sodium) you eat.  Medicine to lower your blood pressure.  Staying in the hospital, if your condition is severe. There, treatment will focus on controlling your blood pressure and the amount of fluids in your body (fluid retention).  You may need to take medicine (magnesium sulfate) to prevent seizures. This medicine may be given as an injection or through an IV tube.  Delivering your baby early, if your condition gets worse. You may have your labor started with medicine (induced), or you may have a cesarean delivery.  Follow these instructions at home: Eating and drinking   Drink enough fluid to keep your urine clear or pale yellow.  Eat a healthy diet that is low in sodium. Do not add salt to your  food. Check nutrition labels to see how much sodium a food or beverage contains.  Avoid caffeine. Lifestyle  Do not use any products that contain nicotine or tobacco, such as cigarettes and e-cigarettes. If you need help quitting, ask your health care provider.  Do not use alcohol or drugs.  Avoid stress as much as possible. Rest and get plenty of sleep. General instructions  Take over-the-counter and prescription medicines only as told by your health care provider.  When lying down, lie on your side. This keeps pressure off of your baby.  When sitting or lying down, raise (elevate) your feet. Try putting some pillows underneath your lower legs.  Exercise regularly. Ask your health care provider what kinds of exercise are best for you.  Keep all  follow-up and prenatal visits as told by your health care provider. This is important. How is this prevented? To prevent preeclampsia or eclampsia from developing during another pregnancy:  Get proper medical care during pregnancy. Your health care provider may be able to prevent preeclampsia or diagnose and treat it early.  Your health care provider may have you take a low-dose aspirin or a calcium supplement during your next pregnancy.  You may have tests of your blood pressure and kidney function after giving birth.  Maintain a healthy weight. Ask your health care provider for help managing weight gain during pregnancy.  Work with your health care provider to manage any long-term (chronic) health conditions you have, such as diabetes or kidney problems.  Contact a health care provider if:  You gain more weight than expected.  You have headaches.  You have nausea or vomiting.  You have abdominal pain.  You feel dizzy or light-headed. Get help right away if:  You develop sudden or severe swelling anywhere in your body. This usually happens in the legs.  You gain 5 lbs (2.3 kg) or more during one week.  You have severe: ? Abdominal pain. ? Headaches. ? Dizziness. ? Vision problems. ? Confusion. ? Nausea or vomiting.  You have a seizure.  You have trouble moving any part of your body.  You develop numbness in any part of your body.  You have trouble speaking.  You have any abnormal bleeding.  You pass out. This information is not intended to replace advice given to you by your health care provider. Make sure you discuss any questions you have with your health care provider. Document Released: 12/24/1999 Document Revised: 08/24/2015 Document Reviewed: 08/02/2015 Elsevier Interactive Patient Education  [employer].

## 2017-05-08 NOTE — Discharge Summary (Signed)
Physician Discharge Summary  Patient ID: Samantha Jacobs MRN: [id number] DOB/AGE: 1980-03-31 37 y.o.  Admit date: 05/05/2017 Discharge date: 05/08/2017  Admission Diagnoses:  Discharge Diagnoses:  Active Problems:   Preeclampsia in postpartum period Right Diastolic Heart Failure  Discharged Condition: Improved.  Hospital Course: Pt was given Magnesium Sulfate x 24 hours.  IV Lasix for diuresis.  Consults: cardiology  Significant Diagnostic Studies: labs: below.  Cardiac Echo:  EF 60+%  CBC Latest Ref Rng & Units 05/06/2017 05/05/2017 04/27/2017  WBC 4.0 - 10.5 K/uL 6.9 7.6 11.7(H)  Hemoglobin 12.0 - 15.0 g/dL 4.0(J) 8.1(X) 9.1(Y)  Hematocrit 36.0 - 46.0 % 29.4(L) 28.3(L) 26.2(L)  Platelets 150 - 400 K/uL 351 347 234   CMP Latest Ref Rng & Units 05/08/2017 05/07/2017 05/06/2017  Glucose 65 - 99 mg/dL 97 81 98  BUN 6 - 20 mg/dL Creatinine 0.44 - 1.00 mg/dL 7.82(N) 5.62 1.30  Sodium 135 - 145 mmol/L 137 142 140  Potassium 3.5 - 5.1 mmol/L 4.4 4.2 3.9  Chloride 101 - 111 mmol/L 105 110 108  CO2 22 - 32 mmol/L 22 22 20(L)  Calcium 8.9 - 10.3 mg/dL 9.0 8.6(V) 7.8(I)  Total Protein 6.5 - 8.1 g/dL 6.9 7.3 6.2(L)  Total Bilirubin 0.3 - 1.2 mg/dL 0.4 0.4 0.3  Alkaline Phos 38 - 126 U/L 114 114 117  AST 15 - 41 U/L 29 28 67(H)  ALT 14 - 54 U/L 120(H) 147(H) 254(H)    Treatments: As above  Discharge Exam: Blood pressure 132/71, pulse 72, temperature 98 F (36.7 C), temperature source Oral, resp. rate 18, weight 123.4 kg (272 lb), SpO2 98 %, unknown if currently breastfeeding. Gen:  NAD Abd:  Soft, nontender, incision well healed Ext:  +swelling , no calf tenderness  Disposition:   Discharge Instructions    Remove dressing   Complete by:  As directed      Procardia XL 30 mg po once daily.   Follow-up Information    Geryl Rankins, MD Follow up.   Specialty:  Obstetrics and Gynecology Contact information: [address] [place]  [address] [phone]           Signed: Geryl Rankins 05/08/2017, 8:58 AM

## 2019-01-23 ENCOUNTER — Other Ambulatory Visit: Payer: Self-pay | Admitting: Cardiology

## 2019-01-23 DIAGNOSIS — Z20822 Contact with and (suspected) exposure to covid-19: Secondary | ICD-10-CM

## 2019-01-24 LAB — NOVEL CORONAVIRUS, NAA: SARS-CoV-2, NAA: NOT DETECTED

## 2020-10-25 ENCOUNTER — Other Ambulatory Visit: Payer: Self-pay | Admitting: Obstetrics and Gynecology

## 2020-10-25 DIAGNOSIS — Z1231 Encounter for screening mammogram for malignant neoplasm of breast: Secondary | ICD-10-CM

## 2020-11-26 ENCOUNTER — Ambulatory Visit
Admission: RE | Admit: 2020-11-26 | Discharge: 2020-11-26 | Disposition: A | Discharge status: Home / Self Care | Payer: BC Managed Care – PPO | Source: Ambulatory Visit | Attending: Obstetrics and Gynecology | Admitting: Obstetrics and Gynecology

## 2020-11-26 ENCOUNTER — Other Ambulatory Visit: Payer: Self-pay

## 2020-11-26 DIAGNOSIS — Z1231 Encounter for screening mammogram for malignant neoplasm of breast: Secondary | ICD-10-CM

## 2020-11-29 ENCOUNTER — Other Ambulatory Visit: Payer: Self-pay | Admitting: Obstetrics and Gynecology

## 2020-11-29 DIAGNOSIS — R928 Other abnormal and inconclusive findings on diagnostic imaging of breast: Secondary | ICD-10-CM

## 2021-01-04 ENCOUNTER — Other Ambulatory Visit: Payer: Self-pay

## 2021-01-04 ENCOUNTER — Ambulatory Visit: Payer: BC Managed Care – PPO

## 2021-01-04 ENCOUNTER — Ambulatory Visit
Admission: RE | Admit: 2021-01-04 | Discharge: 2021-01-04 | Disposition: A | Discharge status: Home / Self Care | Payer: BC Managed Care – PPO | Source: Ambulatory Visit | Attending: Obstetrics and Gynecology | Admitting: Obstetrics and Gynecology

## 2021-01-04 DIAGNOSIS — R928 Other abnormal and inconclusive findings on diagnostic imaging of breast: Secondary | ICD-10-CM

## 2021-12-05 ENCOUNTER — Other Ambulatory Visit: Payer: Self-pay | Admitting: Obstetrics and Gynecology

## 2021-12-05 DIAGNOSIS — Z1231 Encounter for screening mammogram for malignant neoplasm of breast: Secondary | ICD-10-CM

## 2022-01-27 ENCOUNTER — Ambulatory Visit
Admission: RE | Admit: 2022-01-27 | Discharge: 2022-01-27 | Disposition: A | Discharge status: Home / Self Care | Payer: BC Managed Care – PPO | Source: Ambulatory Visit | Attending: Obstetrics and Gynecology | Admitting: Obstetrics and Gynecology

## 2022-01-27 DIAGNOSIS — Z1231 Encounter for screening mammogram for malignant neoplasm of breast: Secondary | ICD-10-CM

## 2022-07-20 IMAGING — MG MM DIGITAL DIAGNOSTIC UNILAT*L* W/ TOMO W/ CAD
6 series · 6 of 18 positions shown · non-contrast
Comparison: Screening exam, which was the baseline study,
11/26/2020.

CLINICAL DATA: Screening recall for possible left breast masses.

EXAM:
DIGITAL DIAGNOSTIC UNILATERAL LEFT MAMMOGRAM WITH TOMOSYNTHESIS AND
CAD
TECHNIQUE: Left digital diagnostic mammography and breast tomosynthesis was
performed. The images were evaluated with computer-aided detection.

[L CC synth-2D]
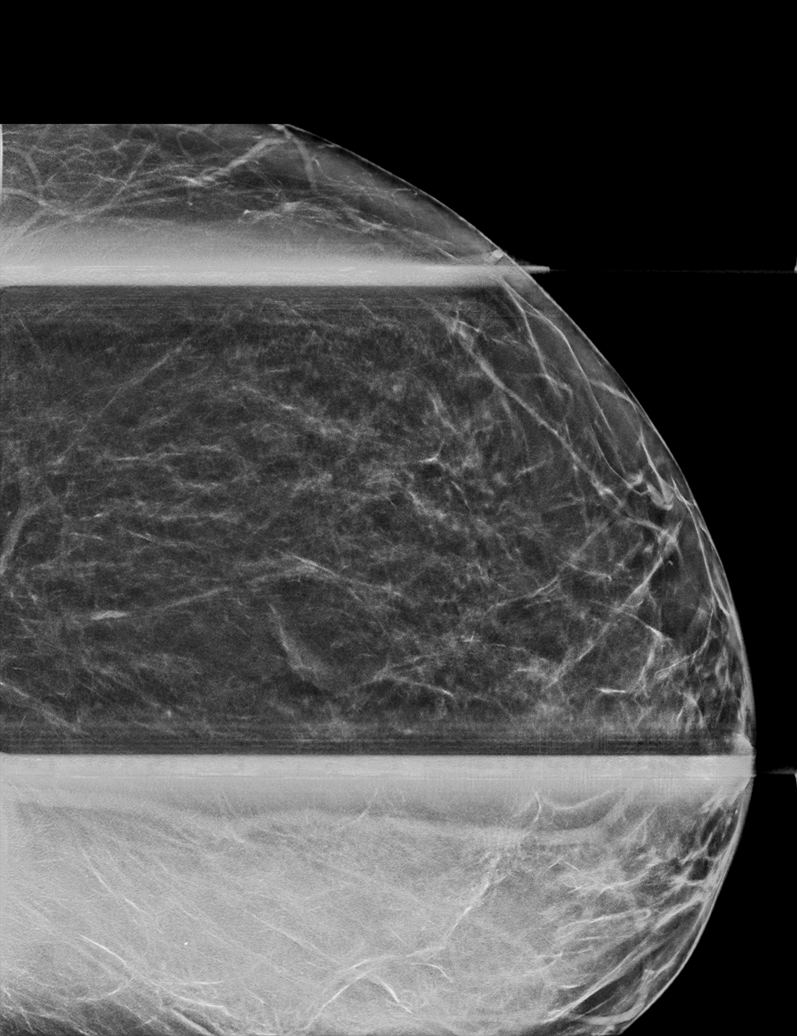

[L MLO synth-2D (1 of 2)]
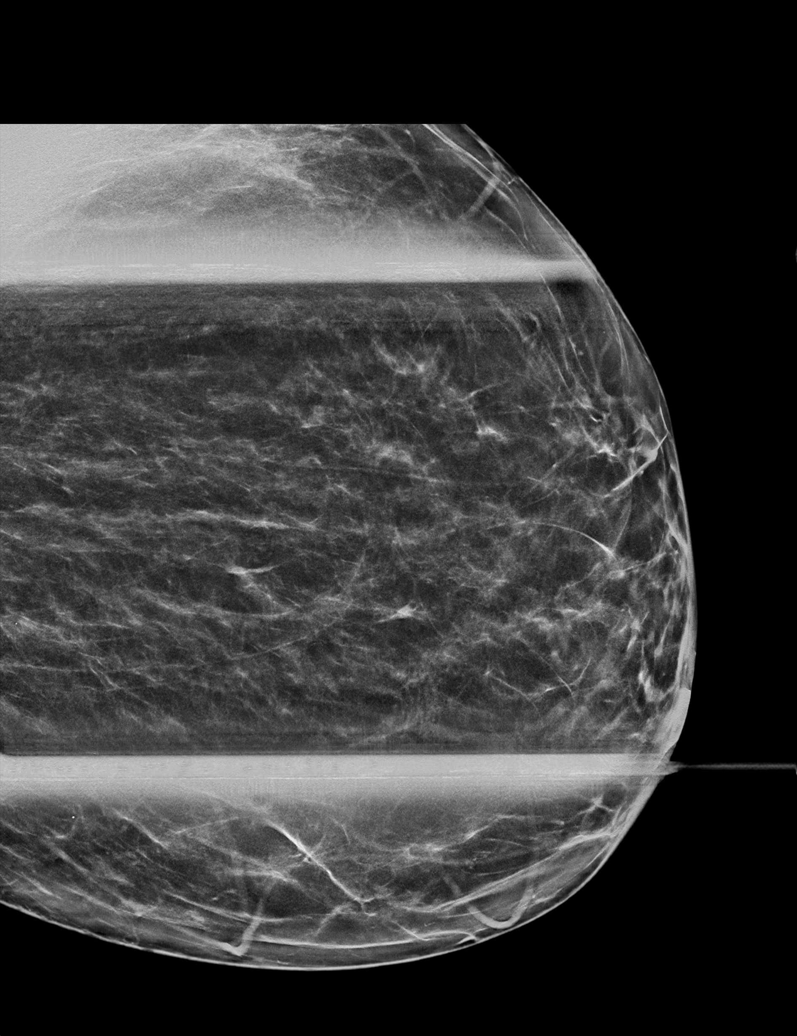

[L MLO synth-2D (2 of 2)]
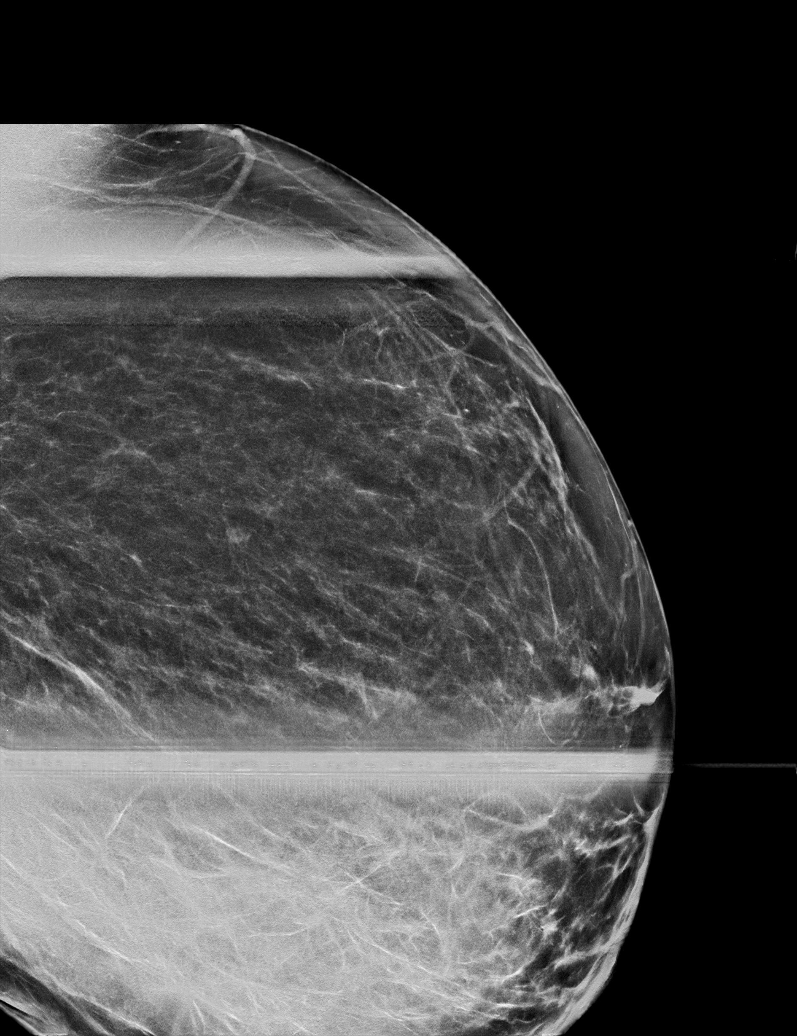

[L CC tomo · tomo slice 32/63.0]
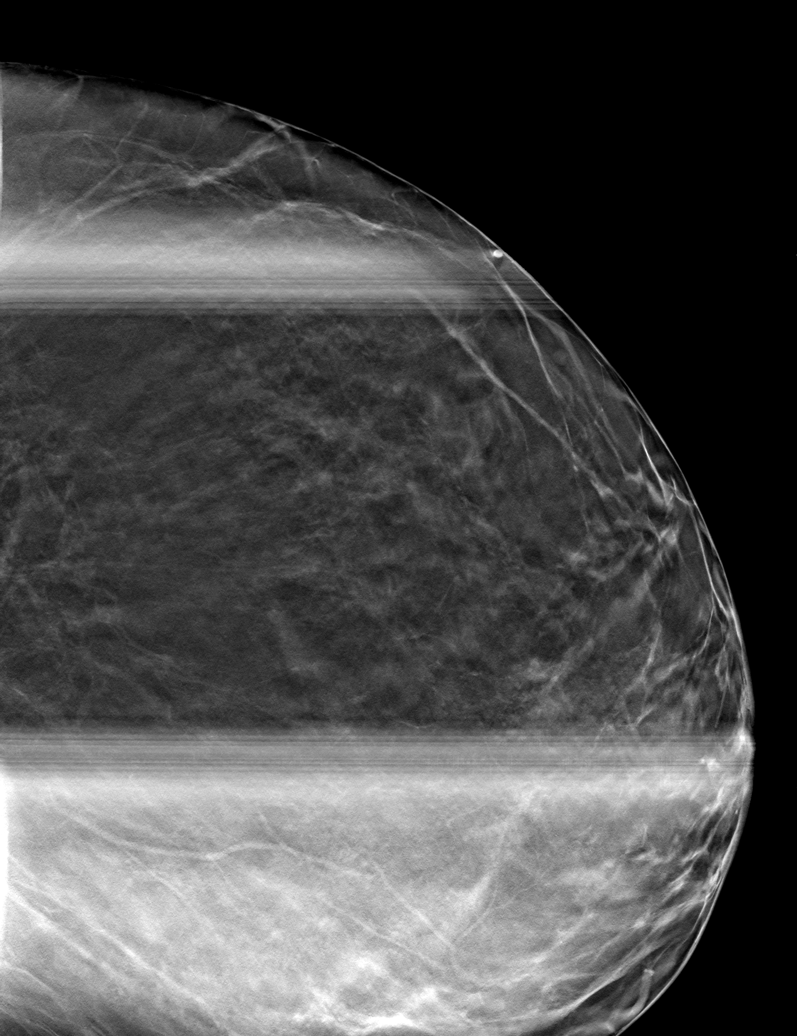

[L MLO tomo (1 of 2) · tomo slice 35/69.0]
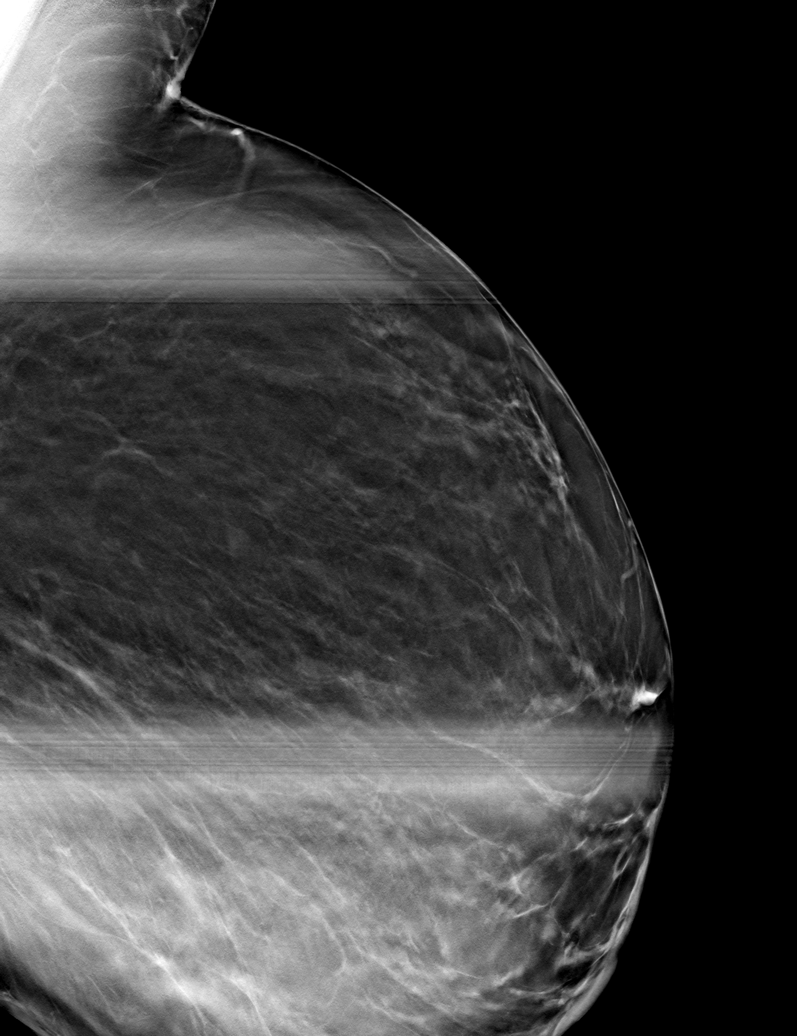

[L MLO tomo (2 of 2) · tomo slice 34/67.0]
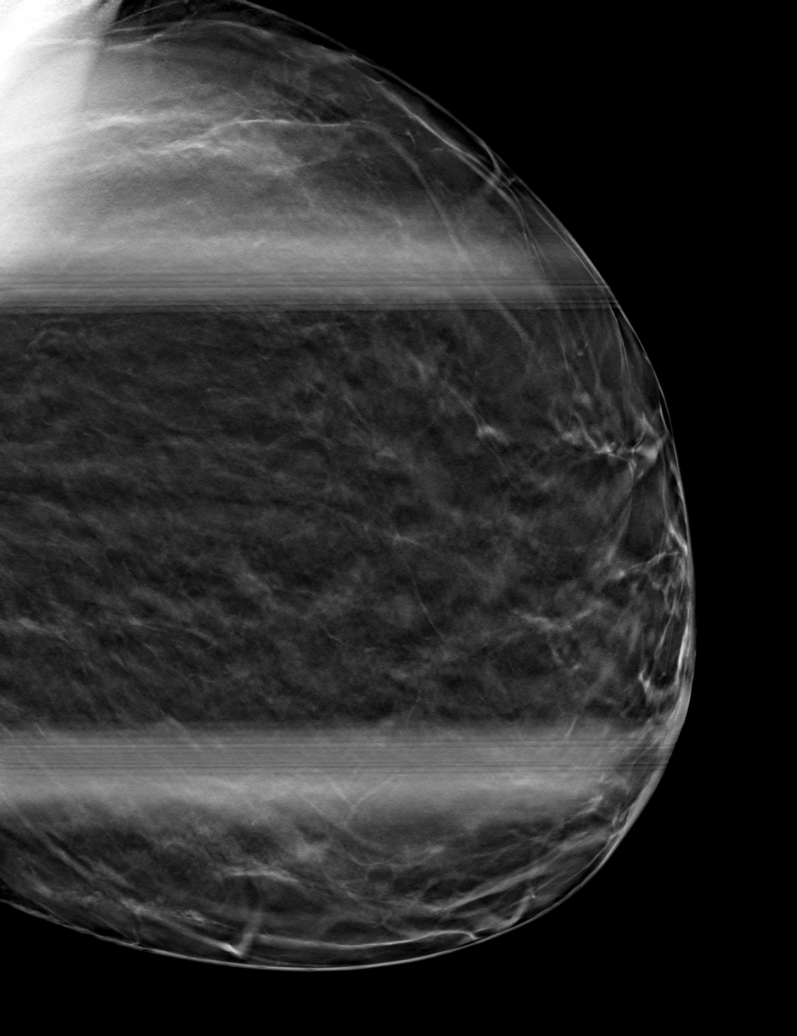

[6 of 18 positions shown; findings below may reference images not displayed]

ACR Breast Density Category b: There are scattered areas of
fibroglandular density.
FINDINGS: On the diagnostic spot-compression images, the possible masses
suggested on the current screening exam disperse consistent with
superimposed fibroglandular tissue. There is no residual mass or
significant asymmetry. There are no areas of architectural
distortion and there are no suspicious calcifications.
IMPRESSION: No evidence of breast malignancy.

RECOMMENDATION:
Screening mammogram in one year.(Code:W1-A-IT2)

I have discussed the findings and recommendations with the patient.
If applicable, a reminder letter will be sent to the patient
regarding the next appointment.

BI-RADS CATEGORY  1: Negative.

## 2023-02-06 ENCOUNTER — Other Ambulatory Visit: Payer: Self-pay | Admitting: Obstetrics and Gynecology

## 2023-02-06 DIAGNOSIS — Z1231 Encounter for screening mammogram for malignant neoplasm of breast: Secondary | ICD-10-CM

## 2023-02-16 ENCOUNTER — Ambulatory Visit: Payer: BC Managed Care – PPO

## 2023-02-20 ENCOUNTER — Ambulatory Visit
Admission: RE | Admit: 2023-02-20 | Discharge: 2023-02-20 | Disposition: A | Discharge status: Home / Self Care | Payer: BC Managed Care – PPO | Source: Ambulatory Visit | Attending: Obstetrics and Gynecology | Admitting: Obstetrics and Gynecology

## 2023-02-20 DIAGNOSIS — Z1231 Encounter for screening mammogram for malignant neoplasm of breast: Secondary | ICD-10-CM

## 2023-03-15 ENCOUNTER — Ambulatory Visit
Admission: EM | Admit: 2023-03-15 | Discharge: 2023-03-15 | Disposition: A | Discharge status: Home / Self Care | Attending: Family Medicine | Admitting: Family Medicine

## 2023-03-15 DIAGNOSIS — H6992 Unspecified Eustachian tube disorder, left ear: Secondary | ICD-10-CM | POA: Diagnosis not present

## 2023-03-15 MED ORDER — PSEUDOEPHEDRINE HCL 60 MG PO TABS: 60.0000 mg | ORAL_TABLET | Freq: Three times a day (TID) | ORAL | 0 refills | Status: DC | PRN

## 2023-03-15 MED ORDER — CETIRIZINE HCL 10 MG PO TABS: 10.0000 mg | ORAL_TABLET | Freq: Every day | ORAL | 0 refills | Status: DC

## 2023-03-15 MED ORDER — FLUTICASONE PROPIONATE 50 MCG/ACT NA SUSP: 2.0000 | Freq: Every day | NASAL | 0 refills | Status: DC

## 2023-03-15 NOTE — ED Provider Notes (Signed)
 [facility]  Note:  This document was prepared using Conservation officer, historic buildings and may include unintentional dictation errors.  MRN: [id number] DOB: [birth year]  Subjective:   Samantha Jacobs is a 43 y.o. female presenting for 3-day history of persistent pressure, ringing, slight dizziness with her left ear.  No fever, drainage of pus or bleeding.  No sinus symptoms.  No current facility-administered medications for this encounter.  Current Outpatient Medications:    docusate sodium (COLACE) 100 MG capsule, Take 1 capsule (100 mg total) by mouth 2 (two) times daily., Disp: 30 capsule, Rfl: 0   Fe Fum-FePoly-Vit C-Vit B3 (INTEGRA) 62.5-62.5-40-3 MG CAPS, Take 1 capsule by mouth daily., Disp: , Rfl:    ibuprofen (ADVIL,MOTRIN) 600 MG tablet, Take 1 tablet (600 mg total) by mouth every 6 (six) hours., Disp: 30 tablet, Rfl: 0   NIFEdipine (PROCARDIA-XL/ADALAT CC) 30 MG 24 hr tablet, Take 1 tablet (30 mg total) by mouth daily., Disp: 30 tablet, Rfl: 2   oxyCODONE (OXY IR/ROXICODONE) 5 MG immediate release tablet, Take 1 tablet (5 mg total) by mouth every 6 (six) hours as needed for moderate pain or severe pain (pain scale 4-7). (Patient not taking: Reported on 05/06/2017), Disp: 25 tablet, Rfl: 0   Prenatal Vit-Fe Fumarate-FA (PRENATAL MULTIVITAMIN) TABS tablet, Take 1 tablet by mouth daily at 12 noon., Disp: , Rfl:    simethicone (MYLICON) 80 MG chewable tablet, Chew 80 mg by mouth 4 (four) times daily as needed for flatulence., Disp: , Rfl:    No Known Allergies  Past Medical History:  Diagnosis Date   Medical history non-contributory      Past Surgical History:  Procedure Laterality Date   CESAREAN SECTION N/A 04/26/2017   Procedure: CESAREAN SECTION;  Surgeon: Myna Hidalgo, DO;  Location: [facility];  Service: Obstetrics;  Laterality: N/A;   CHOLECYSTECTOMY  09/21/2011   Procedure: LAPAROSCOPIC CHOLECYSTECTOMY WITH INTRAOPERATIVE CHOLANGIOGRAM;   Surgeon: Emelia Loron, MD;  Location: [facility];  Service: General;  Laterality: N/A;  laparoscopic cholescystectomy with cholangiogram    Family History  Problem Relation Age of Onset   Breast cancer Neg Hx     Social History   Tobacco Use   Smoking status: Former    Current packs/day: 0.00    Types: Cigarettes    Quit date: 01/09/2001    Years since quitting: 22.1   Smokeless tobacco: Never  Substance Use Topics   Alcohol use: Yes    Comment: OCCASIONAL   Drug use: No    ROS   Objective:   Vitals: BP 135/86 (BP Location: Right Arm)   Pulse 62   Temp 98.2 F (36.8 C) (Oral)   Resp 16   LMP  (Within Weeks) Comment: 1 week  SpO2 98%   Physical Exam Constitutional:      General: She is not in acute distress.    Appearance: Normal appearance. She is well-developed. She is not ill-appearing, toxic-appearing or diaphoretic.  HENT:     Head: Normocephalic and atraumatic.     Right Ear: Tympanic membrane, ear canal and external ear normal. No tenderness. There is no impacted cerumen. Tympanic membrane is not injected, perforated, erythematous or bulging.     Left Ear: Tympanic membrane, ear canal and external ear normal. No tenderness. There is no impacted cerumen. Tympanic membrane is not injected, perforated, erythematous or bulging.     Nose: Nose normal.     Mouth/Throat:     Mouth: Mucous membranes  are moist.  Eyes:     General: No scleral icterus.       Right eye: No discharge.        Left eye: No discharge.     Extraocular Movements: Extraocular movements intact.  Cardiovascular:     Rate and Rhythm: Normal rate.  Pulmonary:     Effort: Pulmonary effort is normal.  Skin:    General: Skin is warm and dry.  Neurological:     General: No focal deficit present.     Mental Status: She is alert and oriented to person, place, and time.  Psychiatric:        Mood and Affect: Mood normal.        Behavior: Behavior normal.     Assessment and Plan :   PDMP  not reviewed this encounter.  1. Eustachian tube dysfunction, left    Unremarkable ENT exam.  Will use conservative management for what I suspect is eustachian tube dysfunction.  Recommended starting Flonase, Zyrtec, Sudafed. Counseled patient on potential for adverse effects with medications prescribed/recommended today, ER and return-to-clinic precautions discussed, patient verbalized understanding.    Wallis Bamberg, [place] 03/15/23 [phone]

## 2023-03-15 NOTE — ED Triage Notes (Signed)
 Pt reports pressure on left ear x 2-3 days. States she started feeling off balance this morning.

## 2023-05-18 LAB — HEPATITIS C ANTIBODY: HCV Ab: NEGATIVE

## 2023-05-18 LAB — OB RESULTS CONSOLE ANTIBODY SCREEN: Antibody Screen: NEGATIVE

## 2023-05-18 LAB — OB RESULTS CONSOLE HIV ANTIBODY (ROUTINE TESTING): HIV: NONREACTIVE

## 2023-05-18 LAB — OB RESULTS CONSOLE RUBELLA ANTIBODY, IGM: Rubella: IMMUNE

## 2023-05-18 LAB — OB RESULTS CONSOLE HEPATITIS B SURFACE ANTIGEN: Hepatitis B Surface Ag: NEGATIVE

## 2023-06-15 LAB — OB RESULTS CONSOLE GC/CHLAMYDIA
Chlamydia: NEGATIVE
Neisseria Gonorrhea: NEGATIVE

## 2023-10-11 LAB — OB RESULTS CONSOLE RPR: RPR: NONREACTIVE

## 2023-12-11 ENCOUNTER — Encounter (HOSPITAL_COMMUNITY): Payer: Self-pay | Admitting: *Deleted

## 2023-12-11 ENCOUNTER — Encounter (HOSPITAL_COMMUNITY): Payer: Self-pay

## 2023-12-11 ENCOUNTER — Telehealth (HOSPITAL_COMMUNITY): Payer: Self-pay | Admitting: *Deleted

## 2023-12-11 NOTE — Telephone Encounter (Signed)
 Preadmission screen

## 2023-12-12 ENCOUNTER — Telehealth (HOSPITAL_COMMUNITY): Payer: Self-pay | Admitting: *Deleted

## 2023-12-12 NOTE — Telephone Encounter (Signed)
 Preadmission screen

## 2023-12-13 ENCOUNTER — Telehealth (HOSPITAL_COMMUNITY): Payer: Self-pay | Admitting: *Deleted

## 2023-12-13 NOTE — Telephone Encounter (Signed)
 Preadmission screen

## 2023-12-17 ENCOUNTER — Encounter (HOSPITAL_COMMUNITY): Payer: Self-pay

## 2023-12-17 NOTE — Patient Instructions (Signed)
 Samantha Jacobs  12/17/2023   Your procedure is scheduled on:  12/26/2023  Arrive at 0630 at [facility] [address] at [facility]  and [address]. You are invited to use the FREE valet parking or use the Visitor's parking deck.  Pick up the phone at the desk and dial [phone].  Call this number if you have problems the morning of surgery: [phone]  Remember:   Do not eat food:(After Midnight) Desps de medianoche.  You may drink clear liquids until  __0430___.  Clear liquids means a liquid you can see thru.  It can have color such as Cola or Kool aid.  Tea is OK and coffee as long as no milk or creamer of any kind.  Take these medicines the morning of surgery with A SIP OF WATER:  Take nifedipine  as prescribed   Do not wear jewelry, make-up or nail polish.  Do not wear lotions, powders, or perfumes. Do not wear deodorant.  Do not shave 48 hours prior to surgery.  Do not bring valuables to the hospital.  [facility] is not   responsible for any belongings or valuables brought to the hospital.  Contacts, dentures or bridgework may not be worn into surgery.  Leave suitcase in the car. After surgery it may be brought to your room.  For patients admitted to the hospital, checkout time is 11:00 AM the day of              discharge.      Please read over the following fact sheets that you were given:     Preparing for Surgery

## 2023-12-24 ENCOUNTER — Encounter (HOSPITAL_COMMUNITY)
Admission: RE | Admit: 2023-12-24 | Discharge: 2023-12-24 | Disposition: A | Source: Ambulatory Visit | Attending: Student

## 2023-12-24 DIAGNOSIS — Z01812 Encounter for preprocedural laboratory examination: Secondary | ICD-10-CM | POA: Insufficient documentation

## 2023-12-24 DIAGNOSIS — O10913 Unspecified pre-existing hypertension complicating pregnancy, third trimester: Secondary | ICD-10-CM

## 2023-12-24 DIAGNOSIS — O34219 Maternal care for unspecified type scar from previous cesarean delivery: Secondary | ICD-10-CM | POA: Insufficient documentation

## 2023-12-24 DIAGNOSIS — Z3A37 37 weeks gestation of pregnancy: Secondary | ICD-10-CM | POA: Insufficient documentation

## 2023-12-24 HISTORY — DX: Personal history of other complications of pregnancy, childbirth and the puerperium: Z87.59

## 2023-12-24 HISTORY — DX: Essential (primary) hypertension: I10

## 2023-12-24 LAB — TYPE AND SCREEN
ABO/RH(D): B POS
Antibody Screen: NEGATIVE

## 2023-12-24 LAB — CBC
HCT: 33.8 % — ABNORMAL LOW (ref 36.0–46.0)
Hemoglobin: 10.9 g/dL — ABNORMAL LOW (ref 12.0–15.0)
MCH: 26.3 pg (ref 26.0–34.0)
MCHC: 32.2 g/dL (ref 30.0–36.0)
MCV: 81.4 fL (ref 80.0–100.0)
Platelets: 298 K/uL (ref 150–400)
RBC: 4.15 MIL/uL (ref 3.87–5.11)
RDW: 14.2 % (ref 11.5–15.5)
WBC: 6 K/uL (ref 4.0–10.5)
nRBC: 0 % (ref 0.0–0.2)

## 2023-12-24 LAB — SYPHILIS: RPR W/REFLEX TO RPR TITER AND TREPONEMAL ANTIBODIES, TRADITIONAL SCREENING AND DIAGNOSIS ALGORITHM: RPR Ser Ql: NONREACTIVE

## 2023-12-24 NOTE — Anesthesia Preprocedure Evaluation (Signed)
 Anesthesia Evaluation  Patient identified by MRN, date of birth, ID band Patient awake    Reviewed: Allergy & Precautions, NPO status , Patient's Chart, lab work & pertinent test results  History of Anesthesia Complications Negative for: history of anesthetic complications  Airway Mallampati: I  TM Distance: >3 FB Neck ROM: Full    Dental  (+) Dental Advisory Given   Pulmonary neg shortness of breath, neg sleep apnea, neg COPD, neg recent URI, former smoker   Pulmonary exam normal breath sounds clear to auscultation       Cardiovascular hypertension (nifedipine ), Pt. on medications (-) angina (-) Past MI, (-) Cardiac Stents and (-) CABG (-) dysrhythmias  Rhythm:Regular Rate:Normal  TTE 05/07/2017: Normal LV function; mild TR.     Neuro/Psych negative neurological ROS     GI/Hepatic negative GI ROS, Neg liver ROS,,,  Endo/Other  neg diabetes  Class 3 obesity  Renal/GU negative Renal ROS     Musculoskeletal   Abdominal  (+) + obese  Peds  Hematology  (+) Blood dyscrasia, anemia Lab Results      Component                Value               Date                      WBC                      6.0                 12/24/2023                HGB                      10.9 (L)            12/24/2023                HCT                      33.8 (L)            12/24/2023                MCV                      81.4                12/24/2023                PLT                      298                 12/24/2023              Anesthesia Other Findings Takes baby aspirin  Reproductive/Obstetrics (+) Pregnancy Previous c-section x1                              Anesthesia Physical Anesthesia Plan  ASA: 3  Anesthesia Plan: Spinal   Post-op Pain Management:    Induction:   PONV Risk Score and Plan: 2 and Ondansetron , Dexamethasone  and Treatment may vary due to age or medical condition  Airway  Management Planned: Natural Airway  Additional Equipment:  Intra-op Plan:   Post-operative Plan:   Informed Consent: I have reviewed the patients History and Physical, chart, labs and discussed the procedure including the risks, benefits and alternatives for the proposed anesthesia with the patient or authorized representative who has indicated his/her understanding and acceptance.       Plan Discussed with: CRNA and Anesthesiologist  Anesthesia Plan Comments: (I have discussed risks of neuraxial anesthesia including but not limited to infection, bleeding, nerve injury, back pain, headache, seizures, and failure of block. Patient denies bleeding disorders and is not currently anticoagulated. Labs have been reviewed. Risks and benefits discussed. All patient's questions answered.  )         Anesthesia Quick Evaluation

## 2023-12-26 ENCOUNTER — Encounter (HOSPITAL_COMMUNITY): Payer: Self-pay | Admitting: Student

## 2023-12-26 ENCOUNTER — Inpatient Hospital Stay (HOSPITAL_COMMUNITY): Payer: Self-pay | Admitting: Anesthesiology

## 2023-12-26 ENCOUNTER — Other Ambulatory Visit: Payer: Self-pay

## 2023-12-26 ENCOUNTER — Encounter (HOSPITAL_COMMUNITY): Admission: RE | Disposition: A | Payer: Self-pay | Source: Home / Self Care | Attending: Student

## 2023-12-26 ENCOUNTER — Inpatient Hospital Stay (HOSPITAL_COMMUNITY)
Admission: RE | Admit: 2023-12-26 | Discharge: 2023-12-28 | DRG: 787 | Disposition: A | Attending: Student | Admitting: Student

## 2023-12-26 DIAGNOSIS — Z3A38 38 weeks gestation of pregnancy: Secondary | ICD-10-CM

## 2023-12-26 DIAGNOSIS — O1002 Pre-existing essential hypertension complicating childbirth: Secondary | ICD-10-CM | POA: Diagnosis not present

## 2023-12-26 DIAGNOSIS — O99214 Obesity complicating childbirth: Secondary | ICD-10-CM | POA: Diagnosis present

## 2023-12-26 DIAGNOSIS — O1092 Unspecified pre-existing hypertension complicating childbirth: Secondary | ICD-10-CM | POA: Diagnosis present

## 2023-12-26 DIAGNOSIS — Z87891 Personal history of nicotine dependence: Secondary | ICD-10-CM | POA: Diagnosis not present

## 2023-12-26 DIAGNOSIS — O9902 Anemia complicating childbirth: Secondary | ICD-10-CM | POA: Diagnosis present

## 2023-12-26 DIAGNOSIS — O3663X0 Maternal care for excessive fetal growth, third trimester, not applicable or unspecified: Secondary | ICD-10-CM | POA: Diagnosis present

## 2023-12-26 DIAGNOSIS — O321XX0 Maternal care for breech presentation, not applicable or unspecified: Secondary | ICD-10-CM | POA: Diagnosis present

## 2023-12-26 DIAGNOSIS — E66813 Obesity, class 3: Secondary | ICD-10-CM | POA: Diagnosis present

## 2023-12-26 DIAGNOSIS — O34219 Maternal care for unspecified type scar from previous cesarean delivery: Secondary | ICD-10-CM

## 2023-12-26 DIAGNOSIS — O34211 Maternal care for low transverse scar from previous cesarean delivery: Secondary | ICD-10-CM | POA: Diagnosis present

## 2023-12-26 DIAGNOSIS — Z8249 Family history of ischemic heart disease and other diseases of the circulatory system: Secondary | ICD-10-CM | POA: Diagnosis not present

## 2023-12-26 DIAGNOSIS — O09523 Supervision of elderly multigravida, third trimester: Secondary | ICD-10-CM | POA: Diagnosis not present

## 2023-12-26 DIAGNOSIS — O403XX0 Polyhydramnios, third trimester, not applicable or unspecified: Secondary | ICD-10-CM | POA: Diagnosis not present

## 2023-12-26 DIAGNOSIS — O10913 Unspecified pre-existing hypertension complicating pregnancy, third trimester: Secondary | ICD-10-CM

## 2023-12-26 LAB — OB RESULTS CONSOLE GBS: GBS: NEGATIVE

## 2023-12-26 SURGERY — Surgical Case
Anesthesia: Spinal

## 2023-12-26 MED ORDER — CEFAZOLIN SODIUM-DEXTROSE 3-4 GM/150ML-% IV SOLN
INTRAVENOUS | Status: AC
  Filled 2023-12-26: qty 150

## 2023-12-26 MED ORDER — DEXTROSE 5 % IV SOLN
INTRAVENOUS | Status: DC | PRN
  Administered 2023-12-26: 09:00:00 3 g via INTRAVENOUS

## 2023-12-26 MED ORDER — ENOXAPARIN SODIUM 80 MG/0.8ML IJ SOSY
70.0000 mg | PREFILLED_SYRINGE | INTRAMUSCULAR | Status: DC
  Administered 2023-12-27 – 2023-12-28 (×2): 70 mg via SUBCUTANEOUS
  Filled 2023-12-26 (×2): qty 0.8

## 2023-12-26 MED ORDER — FENTANYL CITRATE (PF) 100 MCG/2ML IJ SOLN: 25.0000 ug | INTRAMUSCULAR | Status: DC | PRN

## 2023-12-26 MED ORDER — NIFEDIPINE ER OSMOTIC RELEASE 30 MG PO TB24
30.0000 mg | ORAL_TABLET | ORAL | Status: DC
  Administered 2023-12-27 – 2023-12-28 (×2): 30 mg via ORAL
  Filled 2023-12-26 (×2): qty 1

## 2023-12-26 MED ORDER — OXYTOCIN-SODIUM CHLORIDE 30-0.9 UT/500ML-% IV SOLN
INTRAVENOUS | Status: DC | PRN
  Administered 2023-12-26: 09:00:00 30 [IU] via INTRAVENOUS

## 2023-12-26 MED ORDER — SENNOSIDES-DOCUSATE SODIUM 8.6-50 MG PO TABS
2.0000 | ORAL_TABLET | Freq: Every day | ORAL | Status: DC
  Administered 2023-12-27 – 2023-12-28 (×2): 2 via ORAL
  Filled 2023-12-26 (×2): qty 2

## 2023-12-26 MED ORDER — WITCH HAZEL-GLYCERIN EX PADS: 1.0000 | MEDICATED_PAD | CUTANEOUS | Status: DC | PRN

## 2023-12-26 MED ORDER — DIPHENHYDRAMINE HCL 25 MG PO CAPS: 25.0000 mg | ORAL_CAPSULE | Freq: Four times a day (QID) | ORAL | Status: DC | PRN

## 2023-12-26 MED ORDER — STERILE WATER FOR IRRIGATION IR SOLN
Status: DC | PRN
  Administered 2023-12-26: 09:00:00 1000 mL

## 2023-12-26 MED ORDER — SIMETHICONE 80 MG PO CHEW
80.0000 mg | CHEWABLE_TABLET | Freq: Three times a day (TID) | ORAL | Status: DC
  Administered 2023-12-26 – 2023-12-27 (×2): 80 mg via ORAL
  Filled 2023-12-26 (×4): qty 1

## 2023-12-26 MED ORDER — FENTANYL CITRATE (PF) 100 MCG/2ML IJ SOLN
INTRAMUSCULAR | Status: DC | PRN
  Administered 2023-12-26: 09:00:00 15 ug via INTRATHECAL

## 2023-12-26 MED ORDER — DEXAMETHASONE SOD PHOSPHATE PF 10 MG/ML IJ SOLN
INTRAMUSCULAR | Status: DC | PRN
  Administered 2023-12-26: 09:00:00 10 mg via INTRAVENOUS

## 2023-12-26 MED ORDER — DIBUCAINE (PERIANAL) 1 % EX OINT: 1.0000 | TOPICAL_OINTMENT | CUTANEOUS | Status: DC | PRN

## 2023-12-26 MED ORDER — PROMETHAZINE (PHENERGAN) 6.25MG IN NS 50ML IVPB: 6.2500 mg | INTRAVENOUS | Status: DC | PRN

## 2023-12-26 MED ORDER — DIPHENHYDRAMINE HCL 50 MG/ML IJ SOLN: 12.5000 mg | Freq: Four times a day (QID) | INTRAMUSCULAR | Status: DC | PRN

## 2023-12-26 MED ORDER — ACETAMINOPHEN 500 MG PO TABS
1000.0000 mg | ORAL_TABLET | Freq: Four times a day (QID) | ORAL | Status: DC
  Administered 2023-12-26 – 2023-12-28 (×8): 1000 mg via ORAL
  Filled 2023-12-26 (×9): qty 2

## 2023-12-26 MED ORDER — ACETAMINOPHEN 10 MG/ML IV SOLN
INTRAVENOUS | Status: DC | PRN
  Administered 2023-12-26: 09:00:00 1000 mg via INTRAVENOUS

## 2023-12-26 MED ORDER — ONDANSETRON HCL 4 MG/2ML IJ SOLN: 4.0000 mg | Freq: Three times a day (TID) | INTRAMUSCULAR | Status: DC | PRN

## 2023-12-26 MED ORDER — PRENATAL MULTIVITAMIN CH
1.0000 | ORAL_TABLET | Freq: Every day | ORAL | Status: DC
  Administered 2023-12-27 – 2023-12-28 (×2): 1 via ORAL
  Filled 2023-12-26 (×2): qty 1

## 2023-12-26 MED ORDER — ONDANSETRON HCL 4 MG/2ML IJ SOLN
INTRAMUSCULAR | Status: DC | PRN
  Administered 2023-12-26: 09:00:00 4 mg via INTRAVENOUS

## 2023-12-26 MED ORDER — SIMETHICONE 80 MG PO CHEW: 80.0000 mg | CHEWABLE_TABLET | ORAL | Status: DC | PRN

## 2023-12-26 MED ORDER — ACETAMINOPHEN 500 MG PO TABS: 1000.0000 mg | ORAL_TABLET | Freq: Four times a day (QID) | ORAL | Status: DC

## 2023-12-26 MED ORDER — TRANEXAMIC ACID-NACL 1000-0.7 MG/100ML-% IV SOLN
INTRAVENOUS | Status: AC
  Filled 2023-12-26: qty 100

## 2023-12-26 MED ORDER — KETOROLAC TROMETHAMINE 30 MG/ML IJ SOLN: 30.0000 mg | Freq: Four times a day (QID) | INTRAMUSCULAR | Status: AC | PRN

## 2023-12-26 MED ORDER — PHENYLEPHRINE HCL-NACL 20-0.9 MG/250ML-% IV SOLN
INTRAVENOUS | Status: DC | PRN
  Administered 2023-12-26: 09:00:00 60 ug/min via INTRAVENOUS

## 2023-12-26 MED ORDER — COCONUT OIL OIL: 1.0000 | TOPICAL_OIL | Status: DC | PRN

## 2023-12-26 MED ORDER — SODIUM CHLORIDE 0.9 % IV SOLN: INTRAVENOUS | Status: DC | PRN

## 2023-12-26 MED ORDER — CEFAZOLIN SODIUM-DEXTROSE 3-4 GM/150ML-% IV SOLN: 3.0000 g | INTRAVENOUS | Status: DC

## 2023-12-26 MED ORDER — NALOXONE HCL 0.4 MG/ML IJ SOLN: 0.4000 mg | INTRAMUSCULAR | Status: DC | PRN

## 2023-12-26 MED ORDER — FENTANYL CITRATE (PF) 100 MCG/2ML IJ SOLN
INTRAMUSCULAR | Status: AC
  Filled 2023-12-26: qty 2

## 2023-12-26 MED ORDER — ONDANSETRON HCL 4 MG/2ML IJ SOLN
INTRAMUSCULAR | Status: AC
  Filled 2023-12-26: qty 2

## 2023-12-26 MED ORDER — IBUPROFEN 600 MG PO TABS
600.0000 mg | ORAL_TABLET | Freq: Four times a day (QID) | ORAL | Status: DC
  Administered 2023-12-27 – 2023-12-28 (×4): 600 mg via ORAL
  Filled 2023-12-26 (×4): qty 1

## 2023-12-26 MED ORDER — SODIUM CHLORIDE 0.9 % IR SOLN
Status: DC | PRN
  Administered 2023-12-26: 09:00:00 1000 mL

## 2023-12-26 MED ORDER — KETOROLAC TROMETHAMINE 30 MG/ML IJ SOLN
30.0000 mg | Freq: Once | INTRAMUSCULAR | Status: AC | PRN
  Administered 2023-12-26: 11:00:00 30 mg via INTRAVENOUS

## 2023-12-26 MED ORDER — KETOROLAC TROMETHAMINE 30 MG/ML IJ SOLN
30.0000 mg | Freq: Four times a day (QID) | INTRAMUSCULAR | Status: AC
  Administered 2023-12-26 – 2023-12-27 (×4): 30 mg via INTRAVENOUS
  Filled 2023-12-26 (×4): qty 1

## 2023-12-26 MED ORDER — BUPIVACAINE IN DEXTROSE 0.75-8.25 % IT SOLN
INTRATHECAL | Status: DC | PRN
  Administered 2023-12-26: 09:00:00 1.6 mL via INTRATHECAL

## 2023-12-26 MED ORDER — POVIDONE-IODINE 10 % EX SWAB
2.0000 | Freq: Once | CUTANEOUS | Status: AC
  Administered 2023-12-26: 08:00:00 2 via TOPICAL

## 2023-12-26 MED ORDER — SODIUM CHLORIDE 0.9% FLUSH: 3.0000 mL | INTRAVENOUS | Status: DC | PRN

## 2023-12-26 MED ORDER — MENTHOL 3 MG MT LOZG: 1.0000 | LOZENGE | OROMUCOSAL | Status: DC | PRN

## 2023-12-26 MED ORDER — ZOLPIDEM TARTRATE 5 MG PO TABS: 5.0000 mg | ORAL_TABLET | Freq: Every evening | ORAL | Status: DC | PRN

## 2023-12-26 MED ORDER — OXYTOCIN-SODIUM CHLORIDE 30-0.9 UT/500ML-% IV SOLN: 2.5000 [IU]/h | INTRAVENOUS | Status: AC

## 2023-12-26 MED ORDER — TRANEXAMIC ACID-NACL 1000-0.7 MG/100ML-% IV SOLN
1000.0000 mg | Freq: Once | INTRAVENOUS | Status: AC
  Administered 2023-12-26: 09:00:00 1000 mg via INTRAVENOUS

## 2023-12-26 MED ORDER — OXYCODONE HCL 5 MG PO TABS
5.0000 mg | ORAL_TABLET | ORAL | Status: DC | PRN
  Administered 2023-12-28: 5 mg via ORAL
  Filled 2023-12-26: qty 1

## 2023-12-26 MED ORDER — ACETAMINOPHEN 10 MG/ML IV SOLN
INTRAVENOUS | Status: AC
  Filled 2023-12-26: qty 100

## 2023-12-26 MED ORDER — MORPHINE SULFATE (PF) 0.5 MG/ML IJ SOLN
INTRAMUSCULAR | Status: AC
  Filled 2023-12-26: qty 10

## 2023-12-26 MED ORDER — LACTATED RINGERS IV SOLN: INTRAVENOUS | Status: AC

## 2023-12-26 MED ADMIN — Morphine Sulfate Inj PF 0.5 MG/ML: 150 ug | INTRATHECAL | @ 09:00:00 | NDC 00409381412

## 2023-12-26 MED FILL — Cefazolin Sodium-Dextrose IV Solution 3 GM/150ML-4%: INTRAVENOUS | Qty: 150 | Status: AC

## 2023-12-26 MED FILL — Ketorolac Tromethamine Inj 30 MG/ML: INTRAMUSCULAR | Qty: 1 | Status: AC

## 2023-12-26 SURGICAL SUPPLY — 35 items
BENZOIN TINCTURE PRP APPL 2/3 (GAUZE/BANDAGES/DRESSINGS) IMPLANT
CHLORAPREP W/TINT 26ML (MISCELLANEOUS) ×2 IMPLANT
CLAMP UMBILICAL CORD (MISCELLANEOUS) ×1 IMPLANT
CLOTH BEACON ORANGE TIMEOUT ST (SAFETY) ×1 IMPLANT
DERMABOND ADVANCED .7 DNX12 (GAUZE/BANDAGES/DRESSINGS) ×1 IMPLANT
DRSG OPSITE POSTOP 4X10 (GAUZE/BANDAGES/DRESSINGS) ×1 IMPLANT
DRSG OPSITE POSTOP 4X8 (GAUZE/BANDAGES/DRESSINGS) IMPLANT
ELECTRODE REM PT RTRN 9FT ADLT (ELECTROSURGICAL) ×1 IMPLANT
EXTRACTOR VACUUM BELL STYLE (SUCTIONS) IMPLANT
GAUZE SPONGE 4X4 12PLY STRL LF (GAUZE/BANDAGES/DRESSINGS) IMPLANT
GLOVE BIOGEL PI IND STRL 6.5 (GLOVE) ×1 IMPLANT
GLOVE BIOGEL PI IND STRL 7.0 (GLOVE) ×2 IMPLANT
GLOVE SURG SS PI 6.0 STRL IVOR (GLOVE) ×1 IMPLANT
GOWN STRL REUS W/TWL LRG LVL3 (GOWN DISPOSABLE) ×1 IMPLANT
KIT ABG SYR 3ML LUER SLIP (SYRINGE) IMPLANT
LIGASURE IMPACT 36 18CM CVD LR (INSTRUMENTS) ×1 IMPLANT
MAT PREVALON FULL STRYKER (MISCELLANEOUS) IMPLANT
NDL HYPO 25X5/8 SAFETYGLIDE (NEEDLE) IMPLANT
NEEDLE HYPO 25X5/8 SAFETYGLIDE (NEEDLE) IMPLANT
NS IRRIG 1000ML POUR BTL (IV SOLUTION) ×1 IMPLANT
PACK C SECTION WH (CUSTOM PROCEDURE TRAY) ×1 IMPLANT
PAD ABD 8X10 STRL (GAUZE/BANDAGES/DRESSINGS) IMPLANT
PAD OB MATERNITY 4.3X12.25 (PERSONAL CARE ITEMS) ×1 IMPLANT
PENCIL SMOKE EVAC W/HOLSTER (ELECTROSURGICAL) ×1 IMPLANT
RETAINER VISCERAL (MISCELLANEOUS) IMPLANT
RTRCTR C-SECT PINK 25CM LRG (MISCELLANEOUS) IMPLANT
STRIP CLOSURE SKIN 1/2X4 (GAUZE/BANDAGES/DRESSINGS) IMPLANT
SUT PDS AB 0 CTX 36 PDP370T (SUTURE) IMPLANT
SUT PLAIN ABS 2-0 CT1 27XMFL (SUTURE) ×1 IMPLANT
SUT VIC AB 0 CTX36XBRD ANBCTRL (SUTURE) ×4 IMPLANT
SUT VIC AB 2-0 CT1 TAPERPNT 27 (SUTURE) ×2 IMPLANT
SUT VIC AB 4-0 KS 27 (SUTURE) ×1 IMPLANT
TOWEL OR 17X24 6PK STRL BLUE (TOWEL DISPOSABLE) ×2 IMPLANT
TRAY FOLEY W/BAG SLVR 14FR LF (SET/KITS/TRAYS/PACK) ×1 IMPLANT
WATER STERILE IRR 1000ML POUR (IV SOLUTION) ×1 IMPLANT

## 2023-12-26 NOTE — Op Note (Addendum)
 C-Section Operative Note  Date: 12/26/2023  Preoperative Diagnosis: [redacted] weeks gestation Chronic hypertension in pregnancy  Breech presentation  Previous cesarean section   Postoperative Diagnosis:  [redacted] weeks gestation Chronic hypertension in pregnancy  Breech presentation  Previous cesarean section   Procedure: Repeat low transverse cesarean section without extension  Surgeon: LOIS Sharps, DO Assist: EMERSON Carolus, MD    An experienced assistant was required given the standard of surgical care given the complexity of the case and maternal body habitus.  This assistant was needed for exposure, dissection, suctioning, retraction, instrument exchange, assisting with delivery with administration of fundal pressure, and for overall help during the procedure.     Operative Findings: Gravid uterus. Clear amniotic fluid. Viable female infant in frank breech position weighing 3770g  with APGARS of 8 and 9 at 1 and 5 minutes, respectively. Normal fallopian tubes and ovaries bilaterally. Specimens: Placenta to L&D EBL 435 IVF 1000 UOP 150  Consent:  R/B/A of cesarean section discussed with patient. Alternative would be vaginal delivery which would mean shorter postpartum stay and decreased risk of bleeding. Risks of cesarean section include but are not limited to infection of the uterus, pelvic organs, or skin, inadvertent injury to internal organs, such as bowel or bladder, vasculature, and nerves. If there is major injury, extensive surgery may be required. If injury is minor, it may be treated with relative ease and repaired at the time of injury. Discussed possibility of excessive blood loss and transfusion. If bleeding cannot be controlled using medical or minor surgical methods, a cesarean hysterectomy may be performed which would mean no future fertility. Patient accepts the possibility of blood transfusion, if necessary. Patient understands and agrees to move forward with section.   Operative  Procedure: Patient was taken to the operating room where spinal anesthesia was found to be adequate by Allis clamp test. She was prepped and draped in the normal sterile fashion in the dorsal supine position with a leftward tilt. An appropriate time out was performed. A Pfannenstiel skin incision was then made with the scalpel and carried through to the underlying layer of fascia by sharp dissection and Bovie cautery. The fascia was nicked in the midline and the incision was extended laterally with Mayo scissors. The superior aspect of the incision was grasped Kocher clamps and dissected off the underlying rectus muscles. In a similar fashion, the inferior aspect was dissected off the rectus muscles. Rectus muscles were separated in the midline and the peritoneal cavity entered bluntly. The peritoneal incision was then extended both superiorly and inferiorly with careful attention to avoid both bowel and bladder. The Alexis self-retaining wound retractor was then placed within the incision and the lower uterine segment exposed. The bladder flap was developed with Metzenbaum scissors and pushed away from the lower uterine segment. The lower uterine segment was then incised in a low transverse fashion and the cavity itself entered bluntly. The incision was extended bluntly. Amniotic sac was ruptured and fluid was noted to be clear in color. The infant's buttock was brought to the hysterotomy and the infant delivered without difficulty using the standard breech maneuvers. The cord was clamped and cut as well after 1 minute. The infant was handed off to the waiting pediatric team. The placenta was then spontaneously expressed from the uterus and the uterus cleared of all clots and debris with moist lap sponge. The uterine incision was then repaired in 2 layers the first layer was a running locked layer of 0-vicryl and the second  an imbricating layer of the same suture. The tubes and ovaries were inspected and the  gutters cleared of all clots and debris. The uterine incision was inspected and found to be hemostatic. All instruments and sponges as well as the Alexis retractor were then removed from the abdomen. The peritoneum was then reapproximated with a running 2-0 Vicryl. The fascia was then closed with 0  PDS in a running fashion. Subcutaneous tissue was irrigated and then reapproximated with 2-0 plain in a running fashion. The skin was closed with a subcuticular stitch of 4-0 Vicryl on a Keith needle and then reinforced with dermabond and a Honeycomb dressing. At the conclusion of the procedure all instruments and sponge counts were correct. Patient was taken to the recovery room in good condition with her baby accompanying her skin to skin.   CLAUDENE MORT

## 2023-12-26 NOTE — Transfer of Care (Signed)
 Immediate Anesthesia Transfer of Care Note  Patient: Samantha Jacobs  Procedure(s) Performed: CESAREAN SECTION,  Patient Location: PACU  Anesthesia Type:Spinal  Level of Consciousness: awake, alert , and oriented  Airway & Oxygen Therapy: Patient Spontanous Breathing  Post-op Assessment: Report given to RN and Post -op Vital signs reviewed and stable  Post vital signs: Reviewed and stable  Last Vitals:  Vitals Value Taken Time  BP 130/67 12/26/23 10:04  Temp    Pulse 63 12/26/23 10:07  Resp 11 12/26/23 10:07  SpO2 99 % 12/26/23 10:07  Vitals shown include unfiled device data.  Last Pain:  Vitals:   12/26/23 0713  TempSrc: Oral         Complications: No notable events documented.

## 2023-12-26 NOTE — Interval H&P Note (Signed)
 History and Physical Interval Note:  12/26/2023 8:18 AM  Samantha Jacobs  has presented today for surgery, with the diagnosis of repeat c-section and sterilization.  The various methods of treatment have been discussed with the patient and family. After consideration of risks, benefits and other options for treatment, the patient has consented to  Procedures with comments: CESAREAN SECTION, WITH BILATERAL TUBAL LIGATION (Bilateral) - possible tubal Request OB Fellow assist as a surgical intervention.  The patient's history has been reviewed, patient examined, no change in status, stable for surgery.  I have reviewed the patient's chart and labs.  Questions were answered to the patient's satisfaction.     Larraine DELENA Sharps  She does not desire tubal ligation

## 2023-12-26 NOTE — Anesthesia Postprocedure Evaluation (Signed)
 Anesthesia Post Note  Patient: Samantha Jacobs  Procedure(s) Performed: CESAREAN SECTION     Patient location during evaluation: PACU Anesthesia Type: Spinal Level of consciousness: awake Pain management: pain level controlled Vital Signs Assessment: post-procedure vital signs reviewed and stable Respiratory status: spontaneous breathing, respiratory function stable and nonlabored ventilation Cardiovascular status: blood pressure returned to baseline and stable Postop Assessment: no headache, no backache and no apparent nausea or vomiting Anesthetic complications: no   No notable events documented.  Last Vitals:  Vitals:   12/26/23 1100 12/26/23 1110  BP: 132/70 127/75  Pulse: 61 64  Resp: 19   Temp: 36.5 C (!) 36.3 C  SpO2: 96% 97%    Last Pain:  Vitals:   12/26/23 1110  TempSrc: Oral   Pain Goal:    LLE Motor Response: Purposeful movement (12/26/23 1030)   RLE Motor Response: Purposeful movement (12/26/23 1030)       Epidural/Spinal Function Cutaneous sensation: Able to Wiggle Toes (12/26/23 1100), Patient able to flex knees: Yes (12/26/23 1100), Patient able to lift hips off bed: No (12/26/23 1100), Back pain beyond tenderness at insertion site: No (12/26/23 1100), Progressively worsening motor and/or sensory loss: No (12/26/23 1100), Bowel and/or bladder incontinence post epidural: No (12/26/23 1100)  Delon Aisha Arch

## 2023-12-26 NOTE — Progress Notes (Signed)
 Postpartum Day 0: Cesarean Delivery  Subjective: Patient reports pain is well controlled. Tolerating regular diet w/o N/V. Has not yet ambulated. Voiding via foley catheter. Vaginal bleeding is minimal. Breast-feeding   Objective: Vital signs in last 24 hours: Temp:  [96.3 F (35.7 C)-98.6 F (37 C)] 97.8 F (36.6 C) (12/17 1231) Pulse Rate:  [61-95] 63 (12/17 1231) Resp:  [12-21] 16 (12/17 1231) BP: (118-145)/(67-75) 123/69 (12/17 1231) SpO2:  [96 %-100 %] 98 % (12/17 1231) Weight:  [136.5 kg] 136.5 kg (12/17 0657)  Physical Exam:  General: alert and no distress Lochia: appropriate Uterine Fundus: firm, below the umbilicus Incision: honeycomb over-top DVT Evaluation: No evidence of DVT seen on physical exam.  Recent Labs    12/24/23 0913  HGB 10.9*  HCT 33.8*    Assessment/Plan: 43 yo G2P2 POD0 from rCS w/ BS.  Doing well postoperatively.   Pregnancy complicated by: Chronic hypertension: stable on nifedipine  30 XL Hx pre-e in prior pregnancy BP normotensive to mild range on admit Maternal obesity: pre-pregnancy BMI 40 - SCD ordered  Post-op care: doing well, foley in place, abdominal binder ordered   Post-partum care: breastfeeding, vaginal bleeding minimal Female fetus - desire circ, will perform PPD1-1  Charmaine CHRISTELLA Oz, MD 12/26/2023, 1:05 PM

## 2023-12-26 NOTE — Lactation Note (Signed)
 This note was copied from a baby's chart. Lactation Consultation Note  Patient Name: Samantha Jacobs Today's Date: 12/26/2023 Age:43 hours Reason for consult: Initial assessment;Early term 37-38.6wks P2, Per MOB, infant latched well in recovery, BF for 15 minutes, infant is currently asleep and not due for another feeding in probably another hour. MOB knows to breastfeed infant skin to skin, by cues, 8-12 times within 24 hours. MOB had latch difficulties with her 1st child so she pumped for 2 months. Mob knows to call for latch assistance if needed. LC discussed importance of maternal rest, meals and hydration. MOB was  made aware of O/P services, breastfeeding support groups, community resources, and our phone # for post-discharge questions.    Maternal Data Has patient been taught Hand Expression?: Yes Does the patient have breastfeeding experience prior to this delivery?: Yes  Feeding Mother's Current Feeding Choice: Breast Milk  LATCH Score( latch assessment done by RN) Latch: Grasps breast easily, tongue down, lips flanged, rhythmical sucking.  Audible Swallowing: Spontaneous and intermittent  Type of Nipple: Everted at rest and after stimulation  Comfort (Breast/Nipple): Soft / non-tender  Hold (Positioning): Assistance needed to correctly position infant at breast and maintain latch.  LATCH Score: 9   Lactation Tools Discussed/Used    Interventions Interventions: Breast feeding basics reviewed;Assisted with latch;Skin to skin;Education;Guidelines for Milk Supply and Pumping Schedule Handout;LC Services brochure;CDC milk storage guidelines;CDC Guidelines for Breast Pump Cleaning  Discharge Pump: DEBP;Personal  Consult Status Consult Status: Follow-up Date: 12/27/23 Follow-up type: In-patient    Grayce LULLA Batter 12/26/2023, 12:13 PM

## 2023-12-26 NOTE — Anesthesia Procedure Notes (Addendum)
 Spinal  Patient location during procedure: OR Start time: 12/26/2023 8:39 AM End time: 12/26/2023 8:43 AM Reason for block: surgical anesthesia  Staffing Performed: other anesthesia staff  Authorized by: Peggye Delon Brunswick, MD   Performed by: Peggye Delon Brunswick, MD  Preanesthetic Checklist Completed: patient identified, IV checked, site marked, risks and benefits discussed, surgical consent, monitors and equipment checked, pre-op evaluation and timeout performed Spinal Block Patient position: sitting Prep: DuraPrep Patient monitoring: blood pressure and continuous pulse ox Approach: midline Location: L3-4 Injection technique: single-shot Needle Needle type: Pencan  Needle gauge: 24 G Needle length: 9 cm Assessment Sensory level: T4  Additional Notes Risks and benefits of neuraxial anesthesia including, but not limited to, infection, bleeding, local anesthetic toxicity, headache, hypotension, back pain, block failure, etc. were discussed with the patient. The patient expressed understanding and consented to the procedure. I confirmed that the patient has no bleeding disorders and is not taking blood thinners. I confirmed the patient's last platelet count with the nurse. Monitors were applied. A time-out was performed immediately prior to the procedure. Sterile technique was used throughout the whole procedure.   1 attempt(s) by SRNA, Haleigh Wilkins

## 2023-12-26 NOTE — H&P (Signed)
 Samantha Jacobs is a 43 y.o. female presenting for scheduled c-section.  Pregnancy is complicated by:  1) Prior pregnancy with history of pre-eclampsia: taking ASA 2) Previous c-section x 1: stat for cord prolapse, desires elective repeat 3) Polyhydramnios: @ 29 weeks, resolved by 32 weeks 4) Chronic hypertension: stable on nifedipine  30 XL 5) Advanced maternal age 27) Breech presentation: since 36 weeks 7) Maternal obesity: pre-pregnancy BMI 40  She transferred care from [facility] at 29 weeks.  +FM. Denies LOF, VB, or regular contractions OB History     Gravida  2   Para  1   Term  1   Preterm      AB      Living  1      SAB      IAB      Ectopic      Multiple  0   Live Births  1          Past Medical History:  Diagnosis Date   History of postpartum pre-eclampsia    Hypertension    Medical history non-contributory    Past Surgical History:  Procedure Laterality Date   CESAREAN SECTION N/A 04/26/2017   Procedure: CESAREAN SECTION;  Surgeon: Marilynn Nest, DO;  Location: [facility];  Service: Obstetrics;  Laterality: N/A;   CHOLECYSTECTOMY  09/21/2011   Procedure: LAPAROSCOPIC CHOLECYSTECTOMY WITH INTRAOPERATIVE CHOLANGIOGRAM;  Surgeon: Donnice Bury, MD;  Location: [facility];  Service: General;  Laterality: N/A;  laparoscopic cholescystectomy with cholangiogram   Family History: family history includes Hypertension in her father and mother. Social History:  reports that she quit smoking about 22 years ago. Her smoking use included cigarettes. She has never used smokeless tobacco. She reports current alcohol use. She reports that she does not use drugs.     Maternal Diabetes: No Genetic Screening: Normal Maternal Ultrasounds/Referrals: [facility] Fetal Ultrasounds or other Referrals:  None Maternal Substance Abuse:  No Significant Maternal Medications:  Nifedipine  Significant Maternal Lab Results:  Group B Strep negative Number of Prenatal  Visits:greater than 3 verified prenatal visits Maternal Vaccinations:RSV: Given during pregnancy >/=14 days ago, TDap, and Flu Other Comments:  None  Review of Systems  Constitutional:  Negative for chills and fever.  Respiratory:  Negative for shortness of breath.   Cardiovascular:  Negative for chest pain.  Gastrointestinal:  Negative for abdominal pain and constipation.  Genitourinary:  Positive for pelvic pain. Negative for vaginal bleeding and vaginal discharge.  Neurological:  Negative for headaches.   History   There were no vitals taken for this visit. Exam Physical Exam Constitutional:      General: She is not in acute distress.    Appearance: She is obese.  HENT:     Head: Normocephalic and atraumatic.  Pulmonary:     Effort: Pulmonary effort is normal.  Musculoskeletal:        General: Normal range of motion.  Skin:    General: Skin is warm and dry.  Neurological:     Mental Status: She is alert.  Psychiatric:        Mood and Affect: Mood normal.        Behavior: Behavior normal.     Prenatal labs: ABO, Rh: --/--/B POS (12/15 0920) Antibody: NEG (12/15 0920) Rubella: Immune (05/09 0000) RPR: NON REACTIVE (12/15 0913)  HBsAg: Negative (05/09 0000)  HIV: Non-reactive (05/09 0000)  GBS:  Negative  Assessment/Plan: 43 yo G2P1001 @ 38.0 here for eRCS in setting of cHTN well  controlled on medication  - Verify consent - Has previously been undecided about tubal ligation, will revisit prior to surgery today  - 3g ancef  for surgical prophylaxis, TXA - To OR when ready  Samantha Jacobs 12/26/2023, 6:28 AM

## 2023-12-27 LAB — BIRTH TISSUE RECOVERY COLLECTION (PLACENTA DONATION)

## 2023-12-27 LAB — CBC
HCT: 28.3 % — ABNORMAL LOW (ref 36.0–46.0)
Hemoglobin: 9.3 g/dL — ABNORMAL LOW (ref 12.0–15.0)
MCH: 26.5 pg (ref 26.0–34.0)
MCHC: 32.9 g/dL (ref 30.0–36.0)
MCV: 80.6 fL (ref 80.0–100.0)
Platelets: 258 K/uL (ref 150–400)
RBC: 3.51 MIL/uL — ABNORMAL LOW (ref 3.87–5.11)
RDW: 14 % (ref 11.5–15.5)
WBC: 10.3 K/uL (ref 4.0–10.5)
nRBC: 0 % (ref 0.0–0.2)

## 2023-12-27 NOTE — Progress Notes (Addendum)
 Postpartum Day 1: Cesarean Delivery  Subjective: Patient reports pain is well controlled. Tolerating regular diet w/o N/V. Ambulating and voiding without issue. Vaginal bleeding is minimal. Breast-feeding. Baby boy s/p uncomplicated circ this AM. Passing flatus  Objective: Vital signs in last 24 hours: Temp:  [96.3 F (35.7 C)-98.6 F (37 C)] 98 F (36.7 C) (12/18 0536) Pulse Rate:  [61-95] 64 (12/18 0536) Resp:  [12-21] 18 (12/18 0536) BP: (101-145)/(52-75) 129/61 (12/18 0536) SpO2:  [96 %-100 %] 97 % (12/18 0536) Weight:  [136.5 kg] 136.5 kg (12/17 0657)  Physical Exam:  General: alert and no distress Lochia: appropriate Uterine Fundus: firm, below the umbilicus Incision: honeycomb over-top DVT Evaluation: No evidence of DVT seen on physical exam.  Recent Labs    12/24/23 0913 12/27/23 0444  HGB 10.9* 9.3*  HCT 33.8* 28.3*    Assessment/Plan: 43 yo G2P2 POD1 from rCS w/ BS.  Doing well postoperatively.   Pregnancy complicated by: Chronic hypertension: stable on nifedipine  30 XL Hx pre-e in prior pregnancy BP normotensive since delivery Maternal obesity: pre-pregnancy BMI 40 - SCD ordered  Post-op care: doing well,has abdominal binder  Post-partum care: breastfeeding, vaginal bleeding minimal Female fetus - s/p circ  Lavonia CHRISTELLA Guppy, MD 12/27/2023, 6:29 AM

## 2023-12-28 MED ORDER — OXYCODONE HCL 5 MG PO TABS: 5.0000 mg | ORAL_TABLET | Freq: Four times a day (QID) | ORAL | 0 refills | Status: AC | PRN

## 2023-12-28 MED ORDER — IBUPROFEN 600 MG PO TABS: 600.0000 mg | ORAL_TABLET | Freq: Four times a day (QID) | ORAL | 0 refills | Status: AC

## 2023-12-28 NOTE — Progress Notes (Signed)
 Patient is doing well.  She is tolerating PO, ambulating, voiding.  Pain is controlled.  Lochia is appropriate  Vitals:   12/27/23 0536 12/27/23 2038 12/28/23 0335 12/28/23 0516  BP: 129/61 (!) 125/55 119/77 128/69  Pulse: 64 85 64 66  Resp: 18 18  18   Temp: 98 F (36.7 C) (!) 97.5 F (36.4 C)  97.9 F (36.6 C)  TempSrc: Oral Oral  Oral  SpO2: 97% 98%  99%  Weight:      Height:        NAD Abdomen:  soft, appropriate tenderness, incisions intact and without erythema or drainage ext:    Symmetric, trace pedal edema bilaterally  Lab Results  Component Value Date   WBC 10.3 12/27/2023   HGB 9.3 (L) 12/27/2023   HCT 28.3 (L) 12/27/2023   MCV 80.6 12/27/2023   PLT 258 12/27/2023    --/--/B POS (12/15 0920)  A/P    43 y.o. G2P2002 POD #2 s/p RCS with BS Routine post op and postpartum care.  Meeting all goals CHTN: on procardia  XL 30 mg daily Discharge to home today--1 week BP /incision check

## 2023-12-28 NOTE — Discharge Summary (Signed)
 "    Postpartum Discharge Summary  Date of Service updated 12/28/23     Patient Name: Samantha Jacobs DOB: [birth year] MRN: [id number]  Date of admission: 12/26/2023 Delivery date:12/26/2023 Delivering provider: CLAUDENE MORT A Date of discharge: 12/28/2023  Admitting diagnosis: History of cesarean section [Z98.891] Admission for sterilization [Z30.2] Delivery of pregnancy by cesarean section [O82] Intrauterine pregnancy: [redacted]w[redacted]d     Secondary diagnosis:  Principal Problem:   Delivery of pregnancy by cesarean section  Additional problems: CHTN    Discharge diagnosis: Term Pregnancy Delivered                                              Post partum procedures:n/a Augmentation: N/A Complications: None  Hospital course: Sceduled C/S   43 y.o. yo G2P2002 at [redacted]w[redacted]d was admitted to the hospital 12/26/2023 for scheduled cesarean section with the following indication:Elective Repeat.Delivery details are as follows:  Membrane Rupture Time/Date: 9:04 AM,12/26/2023  Delivery Method:C-Section, Low Transverse Operative Delivery:N/A Details of operation can be found in separate operative note.  Patient had a postpartum course complicated by n/a.  She is ambulating, tolerating a regular diet, passing flatus, and urinating well. Patient is discharged home in stable condition on  12/28/2023        Newborn Data: Birth date:12/26/2023 Birth time:9:05 AM Gender:Female Living status:Living Apgars:8 ,9  Weight:3770 g    Magnesium  Sulfate received: No BMZ received: No Rhophylac:N/A MMR:N/A T-DaP:Given prenatally Flu: Yes RSV Vaccine received: Yes Transfusion:No Immunizations administered: Immunization History  Administered Date(s) Administered   PFIZER(Purple Top)SARS-COV-2 Vaccination 05/29/2019, 06/19/2019    Physical exam  Vitals:   12/27/23 0536 12/27/23 2038 12/28/23 0335 12/28/23 0516  BP: 129/61 (!) 125/55 119/77 128/69  Pulse: 64 85 64 66  Resp: 18 18  18   Temp: 98 F (36.7 C)  (!) 97.5 F (36.4 C)  97.9 F (36.6 C)  TempSrc: Oral Oral  Oral  SpO2: 97% 98%  99%  Weight:      Height:       General: alert, cooperative, and no distress Lochia: appropriate Uterine Fundus: firm Incision: Dressing is clean, dry, and intact DVT Evaluation: Negative Homan's sign. Labs: Lab Results  Component Value Date   WBC 10.3 12/27/2023   HGB 9.3 (L) 12/27/2023   HCT 28.3 (L) 12/27/2023   MCV 80.6 12/27/2023   PLT 258 12/27/2023      Latest Ref Rng & Units 05/08/2017    5:50 AM  CMP  Glucose 65 - 99 mg/dL 97   BUN 6 - 20 mg/dL 17   Creatinine 9.55 - 1.00 mg/dL 8.93   Sodium 864 - 854 mmol/L 137   Potassium 3.5 - 5.1 mmol/L 4.4   Chloride 101 - 111 mmol/L 105   CO2 22 - 32 mmol/L 22   Calcium 8.9 - 10.3 mg/dL 9.0   Total Protein 6.5 - 8.1 g/dL 6.9   Total Bilirubin 0.3 - 1.2 mg/dL 0.4   Alkaline Phos 38 - 126 U/L 114   AST 15 - 41 U/L 29   ALT 14 - 54 U/L 120    Edinburgh Score:    12/26/2023   11:15 AM  Edinburgh Postnatal Depression Scale Screening Tool  I have been able to laugh and see the funny side of things. --      After visit meds:  Allergies as of 12/28/2023  No Known Allergies      Medication List     STOP taking these medications    aspirin EC 81 MG tablet       TAKE these medications    ibuprofen  600 MG tablet Commonly known as: ADVIL  Take 1 tablet (600 mg total) by mouth every 6 (six) hours.   NIFEdipine  30 MG 24 hr tablet Commonly known as: ADALAT  CC Take 1 tablet (30 mg total) by mouth daily. What changed: Another medication with the same name was removed. Continue taking this medication, and follow the directions you see here.   oxyCODONE  5 MG immediate release tablet Commonly known as: Oxy IR/ROXICODONE  Take 1-2 tablets (5-10 mg total) by mouth every 6 (six) hours as needed for severe pain (pain score 7-10).   prenatal multivitamin Tabs tablet Take 1 tablet by mouth daily at 12 noon.         Discharge home  in stable condition Infant Feeding: Bottle and Breast Infant Disposition:home with mother Discharge instruction: per After Visit Summary and Postpartum booklet. Activity: Advance as tolerated. Pelvic rest for 6 weeks.  Diet: routine diet Anticipated Birth Control: BTL done PP Postpartum Appointment:6 weeks Additional Postpartum F/U: BP check 1 week Future Appointments:No future appointments. Follow up Visit:  Follow-up Information     Claudene Mort A, DO Follow up in 1 week(s).   Specialty: Obstetrics and Gynecology Why: BP check + incision check Contact information: [address], [place] [address] [place] [address] [phone]                     12/28/2023 [facility] VELNA GASKINS, MD   "

## 2023-12-28 NOTE — Lactation Note (Signed)
 This note was copied from a baby's chart. Lactation Consultation Note  Patient Name: Samantha Jacobs Unijb'd Date: 12/28/2023 Age:43 hours Reason for consult: Follow-up assessment;Early term 37-38.6wks  P2, Baby has been primarily been formula feeding. Encouraged mother to pump q 3 hours to help establish her milk supply is she chooses to give her baby breastmilk. Reviewed engorgement care and monitoring voids/stools.  Feeding Mother's Current Feeding Choice: Breast Milk and Formula   Lactation Tools Discussed/Used Tools: Pump  Interventions Interventions: Education  Discharge Discharge Education: Engorgement and breast care Pump: DEBP;Personal  Consult Status Consult Status: Complete Date: 12/28/23   Shannon Levorn Lemme  RN, IBCLC 12/28/2023, 10:50 AM

## 2023-12-30 ENCOUNTER — Inpatient Hospital Stay (HOSPITAL_COMMUNITY)
Admission: EM | Admit: 2023-12-30 | Discharge: 2023-12-30 | Disposition: A | Discharge status: Home / Self Care | Attending: Student | Admitting: Student

## 2024-01-05 ENCOUNTER — Telehealth (HOSPITAL_COMMUNITY): Payer: Self-pay

## 2024-01-05 NOTE — Telephone Encounter (Signed)
 01/05/2024 0948  Name: Samantha Jacobs MRN: [id number] DOB: 12-20-1980  Reason for Call:  Transition of Care Hospital Discharge Call  Contact Status: Patient Contact Status: Message  Language assistant needed:          Follow-Up Questions:    Van Postnatal Depression Scale:  In the Past 7 Days:    PHQ2-9 Depression Scale:     Discharge Follow-up:    Post-discharge interventions: NA  Signature  Rosaline Deretha PEAK
